# Patient Record
Sex: Female | Born: 1975 | Race: Black or African American | Hispanic: No | Marital: Married | Smoking: Former smoker
Health system: Southern US, Community
[De-identification: ages and names within clinical notes are randomized; demographics above are authoritative.]

## PROBLEM LIST (undated history)

## (undated) DIAGNOSIS — I10 Essential (primary) hypertension: Secondary | ICD-10-CM

## (undated) DIAGNOSIS — T7840XA Allergy, unspecified, initial encounter: Secondary | ICD-10-CM

## (undated) DIAGNOSIS — K219 Gastro-esophageal reflux disease without esophagitis: Secondary | ICD-10-CM

## (undated) DIAGNOSIS — I471 Supraventricular tachycardia, unspecified: Secondary | ICD-10-CM

## (undated) DIAGNOSIS — E119 Type 2 diabetes mellitus without complications: Secondary | ICD-10-CM

## (undated) HISTORY — PX: CHOLECYSTECTOMY: SHX55

## (undated) HISTORY — DX: Allergy, unspecified, initial encounter: T78.40XA

## (undated) HISTORY — DX: Gastro-esophageal reflux disease without esophagitis: K21.9

## (undated) HISTORY — PX: TUBAL LIGATION: SHX77

## (undated) HISTORY — DX: Supraventricular tachycardia, unspecified: I47.10

---

## 1998-03-29 ENCOUNTER — Emergency Department (HOSPITAL_COMMUNITY): Admission: EM | Admit: 1998-03-29 | Discharge: 1998-03-29 | Payer: Self-pay | Admitting: Emergency Medicine

## 2000-06-18 ENCOUNTER — Inpatient Hospital Stay (HOSPITAL_COMMUNITY): Admission: AD | Admit: 2000-06-18 | Discharge: 2000-06-20 | Payer: Self-pay | Admitting: Obstetrics and Gynecology

## 2001-02-12 ENCOUNTER — Emergency Department (HOSPITAL_COMMUNITY): Admission: EM | Admit: 2001-02-12 | Discharge: 2001-02-12 | Payer: Self-pay | Admitting: Emergency Medicine

## 2001-02-12 ENCOUNTER — Encounter: Payer: Self-pay | Admitting: Emergency Medicine

## 2001-03-19 ENCOUNTER — Emergency Department (HOSPITAL_COMMUNITY): Admission: EM | Admit: 2001-03-19 | Discharge: 2001-03-19 | Payer: Self-pay | Admitting: Emergency Medicine

## 2002-10-18 ENCOUNTER — Encounter: Payer: Self-pay | Admitting: Internal Medicine

## 2002-10-18 ENCOUNTER — Encounter: Admission: RE | Admit: 2002-10-18 | Discharge: 2002-10-18 | Payer: Self-pay | Admitting: Internal Medicine

## 2002-11-14 ENCOUNTER — Observation Stay (HOSPITAL_COMMUNITY): Admission: AD | Admit: 2002-11-14 | Discharge: 2002-11-15 | Payer: Self-pay | Admitting: General Surgery

## 2002-11-14 ENCOUNTER — Encounter (INDEPENDENT_AMBULATORY_CARE_PROVIDER_SITE_OTHER): Payer: Self-pay | Admitting: Specialist

## 2002-11-14 ENCOUNTER — Encounter: Payer: Self-pay | Admitting: General Surgery

## 2003-10-19 ENCOUNTER — Emergency Department (HOSPITAL_COMMUNITY): Admission: EM | Admit: 2003-10-19 | Discharge: 2003-10-19 | Payer: Self-pay | Admitting: Emergency Medicine

## 2004-12-17 ENCOUNTER — Inpatient Hospital Stay (HOSPITAL_COMMUNITY): Admission: AD | Admit: 2004-12-17 | Discharge: 2004-12-17 | Payer: Self-pay | Admitting: *Deleted

## 2004-12-17 ENCOUNTER — Encounter (INDEPENDENT_AMBULATORY_CARE_PROVIDER_SITE_OTHER): Payer: Self-pay | Admitting: *Deleted

## 2005-03-26 ENCOUNTER — Inpatient Hospital Stay (HOSPITAL_COMMUNITY): Admission: AD | Admit: 2005-03-26 | Discharge: 2005-03-28 | Payer: Self-pay | Admitting: Obstetrics

## 2005-03-27 ENCOUNTER — Encounter (INDEPENDENT_AMBULATORY_CARE_PROVIDER_SITE_OTHER): Payer: Self-pay | Admitting: *Deleted

## 2006-12-09 ENCOUNTER — Emergency Department (HOSPITAL_COMMUNITY): Admission: EM | Admit: 2006-12-09 | Discharge: 2006-12-09 | Payer: Self-pay | Admitting: Emergency Medicine

## 2006-12-25 ENCOUNTER — Emergency Department (HOSPITAL_COMMUNITY): Admission: EM | Admit: 2006-12-25 | Discharge: 2006-12-26 | Payer: Self-pay | Admitting: Emergency Medicine

## 2010-06-16 ENCOUNTER — Emergency Department (HOSPITAL_COMMUNITY): Admission: EM | Admit: 2010-06-16 | Discharge: 2010-06-16 | Payer: Self-pay | Admitting: Emergency Medicine

## 2010-11-10 DIAGNOSIS — I639 Cerebral infarction, unspecified: Secondary | ICD-10-CM

## 2010-11-10 HISTORY — DX: Cerebral infarction, unspecified: I63.9

## 2011-01-18 ENCOUNTER — Emergency Department (HOSPITAL_COMMUNITY)
Admission: EM | Admit: 2011-01-18 | Discharge: 2011-01-18 | Disposition: A | Discharge status: Home / Self Care | Payer: Self-pay | Attending: Emergency Medicine | Admitting: Emergency Medicine

## 2011-01-18 DIAGNOSIS — R51 Headache: Secondary | ICD-10-CM | POA: Insufficient documentation

## 2011-01-18 DIAGNOSIS — H9319 Tinnitus, unspecified ear: Secondary | ICD-10-CM | POA: Insufficient documentation

## 2011-01-18 DIAGNOSIS — I10 Essential (primary) hypertension: Secondary | ICD-10-CM | POA: Insufficient documentation

## 2011-01-18 LAB — POCT I-STAT, CHEM 8
Calcium, Ion: 1.09 mmol/L — ABNORMAL LOW (ref 1.12–1.32)
Chloride: 105 mEq/L (ref 96–112)
Creatinine, Ser: 0.8 mg/dL (ref 0.4–1.2)
Hemoglobin: 13.3 g/dL (ref 12.0–15.0)
Sodium: 139 mEq/L (ref 135–145)

## 2011-01-18 LAB — URINALYSIS, ROUTINE W REFLEX MICROSCOPIC
Bilirubin Urine: NEGATIVE
Protein, ur: NEGATIVE mg/dL
Specific Gravity, Urine: 1.022 (ref 1.005–1.030)

## 2011-01-18 LAB — URINE MICROSCOPIC-ADD ON

## 2011-01-24 LAB — POCT PREGNANCY, URINE: Preg Test, Ur: NEGATIVE

## 2011-03-28 NOTE — Discharge Summary (Signed)
NAME:  Ashley Weaver, Ashley Weaver NO.:  [id number]   MEDICAL RECORD NO.:  [id number]          PATIENT TYPE:  INP   LOCATION:  9107                          FACILITY:  [facility]   PHYSICIAN:  Charles A. Clearance Coots, M.D.DATE OF BIRTH:  11/10/1976   DATE OF ADMISSION:  03/26/2005  DATE OF DISCHARGE:                                 DISCHARGE SUMMARY   ADMITTING DIAGNOSES:  1.  Multiparity at term.  2.  Early labor.   DISCHARGE DIAGNOSES:  1.  Multiparity at term.  2.  Early labor.  3.  Status post normal spontaneous vaginal delivery of viable female on Mar 26, 2005 at 1326; Apgars of 9 at one minute and 9 at five minutes;      weight of 3510 g, length of 54 cm. Mother and infant discharged home in      good condition.   REASON FOR ADMISSION:  A 35 year old black female para 2 with estimated date  of confinement of Apr 04, 2005 presents with intrauterine pregnancy at [redacted]  weeks gestation with uterine contractions. She denied rupture of membranes.   PAST MEDICAL HISTORY:  Surgery:  Cholecystectomy. Illnesses:  None.   MEDICATIONS:  Prenatal vitamins.   ALLERGIES:  No known drug allergies.   SOCIAL HISTORY:  Negative for tobacco, alcohol, or recreational drug use.   PHYSICAL EXAMINATION:  GENERAL:  Well-nourished, well-developed black female  in no acute distress.  VITAL SIGNS:  Afebrile, vital signs stable.  ABDOMEN:  Gravid and nontender.  PELVIC:  Cervix 3-4 cm dilated, 60% effaced, and the vertex at a -3 station.   ADMITTING LABORATORY VALUES:  Hemoglobin 11.1; hematocrit 34.4; white blood  cell count 5500; platelets 237,000. RPR was nonreactive.   HOSPITAL COURSE:  The patient was admitted and active rupture of membranes  was performed, and she progressed quite rapidly to a normal spontaneous  vaginal delivery without complications. The postpartum course was  uncomplicated. The patient was taken to the operating room on postpartum day  #1 for a tubal ligation  procedure. There were no complications. The  remainder of the postpartum and postoperative course was uncomplicated and  the patient was discharged home on postpartum day #2 in good condition.   DISCHARGE LABORATORY VALUES:  Hemoglobin 9.7; hematocrit 29.7; white blood  cell count 6000; platelets 194,000.   DISCHARGE DISPOSITION:  1.  Medications:  Tylox and ibuprofen was prescribed for pain. Continue      prenatal vitamins.  2.  Routine written obstructions were given for obstetrical discharge.  3.  The patient is to call the office for a follow-up appointment in 2      weeks.      CAH/MEDQ  D:  03/28/2005  T:  03/28/2005  Job:  [id number]
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
## 2011-03-28 NOTE — Op Note (Signed)
NAME:  Ashley Weaver, Ashley Weaver NO.:  [id number]   MEDICAL RECORD NO.:  [id number]          PATIENT TYPE:  INP   LOCATION:  9107                          FACILITY:  [facility]   PHYSICIAN:  Roseanna Rainbow, M.D.DATE OF BIRTH:  Jun 09, 1976   DATE OF PROCEDURE:  03/27/2005  DATE OF DISCHARGE:                                 OPERATIVE REPORT   PREOPERATIVE DIAGNOSES:  Multiparity, desires a sterilization procedure.   POSTOPERATIVE DIAGNOSES:  Multiparity, desires a sterilization procedure.   PROCEDURE:  Modified Pomeroy bilateral tubal ligation.   SURGEON:  Roseanna Rainbow, M.D.   ANESTHESIA:  General endotracheal, local.   ESTIMATED BLOOD LOSS:  Minimal.   IV FLUIDS:  As per anesthesiology.   DESCRIPTION OF PROCEDURE:  This patient was taken to the operating room.  She was prepped and draped in the usual sterile fashion.  An infraumbilical  skin incision was made with a scalpel and carried down to the underlying  fascia.  The fascia was tented up with Kocher clamps and entered with curved  Mayo scissors.  This incision was extended bilaterally.  The parietal  peritoneum was tented up and enter sharply with Metzenbaum scissors.  This  incision was then extended bilaterally.  The peritoneal incision was felt to  be free of adhesions.  Retractors were then placed into the incision.  The  right fallopian tube was then grasped Babcock clamp.  It was then followed  out to the fimbriated end.  The tube was regrasped in the mid isthmic  region.  A 2 to 3 cm segment of tube was then doubly ligated, with three  ties of 0 plain and excised.  Adequate hemostasis was noted, and the tube  was returned to the abdomen.  The left fallopian tube was then manipulated  in a similar fashion.  The parietal peritoneum and fascia were  reapproximated in one layer, using a running suture of 0 Vicryl.  The skin  was reapproximated in a subcuticular fashion using 3-0 Vicryl.  At the close  of the procedure, the instrument and pad counts were said to be correct  times two.  The patient was taken to the PACU awake and in stable condition.      LAJ/MEDQ  D:  03/27/2005  T:  03/27/2005  Job:  [id number]

## 2011-03-28 NOTE — Op Note (Signed)
NAMELehman Prom                          ACCOUNT NO.:  [id number]   MEDICAL RECORD NO.:  [id number]                   PATIENT TYPE:   LOCATION:                                       FACILITY:   PHYSICIAN:  Angelia Mould. Derrell Lolling, M.D.             DATE OF BIRTH:   DATE OF PROCEDURE:  11/14/2002  DATE OF DISCHARGE:                                 OPERATIVE REPORT   PREOPERATIVE DIAGNOSIS:  Chronic cholecystitis with cholelithiasis.   POSTOPERATIVE DIAGNOSIS:  Chronic cholecystitis with cholelithiasis.   OPERATION PERFORMED:  Laparoscopic cholecystectomy with intraoperative  cholangiogram.   SURGEON:  Angelia Mould. Derrell Lolling, M.D.   FIRST ASSISTANT:  Anselm Pancoast. Zachery Dakins, M.D.   OPERATIVE INDICATIONS:  This is a 35 year old black female who has had a few  episodes of severe upper abdominal pain and bloating and reflux.  Over the  past three to four weeks this has been occurring more frequently, almost  daily and more localized to the right upper quadrant with associated nausea  that occurs frequently after meals.  Workup reveals a gallbladder ultrasound  showing multiple gallstones and contracted gallbladder and a normal common  bile duct.  Liver function tests are normal.  She is brought to the  operating room electively.   OPERATIVE FINDINGS:  The gallbladder was chronically inflamed, discolored,  and slightly thick walled.  The liver, stomach, duodenum, small intestine,  large intestine, and peritoneal surfaces otherwise looked normal.  The  cholangiogram was normal, demonstrating normal intrahepatic and extrahepatic  anatomy, no filling defect and prompt flow of contrast into the duodenum.   OPERATIVE TECHNIQUE:  Following the induction of general endotracheal  anesthesia, the patient's abdomen was prepped and draped in a sterile  fashion.  Marcaine 0.5% with epinephrine was used as a local infiltration  anesthetic.  A vertically oriented incision was made inside the lower rim  of  the umbilicus.  The fascia was incised in the midline and the abdominal  cavity entered under direct vision.  A 10-mm Hasson trocar was inserted and  secured with a pursestring of 0 Vicryl.  Pneumoperitoneum was created.  The  video camera was inserted with visualization and findings as described  above.  A 10-mm trocar was placed in the subxiphoid region.  Two 5-mm  trocars were placed in the right mid abdomen.  The gallbladder fundus was  elevated.  Some adhesions were taken down.  The infundibulum of the  gallbladder was then retracted laterally.  We dissected out the cystic duct  and cystic artery.  The cystic artery was isolated as it went onto the  gallbladder wall, secured with metal clips and divided.  A large window was  created behind the cystic duct.  A metal clip was placed on the cystic duct  close to the gallbladder.  The cystic duct was opened and a cholangiogram  catheter inserted.  We did have good bile return through  the cystic duct.  Cholangiogram was obtained using the C-arm.  The cholangiogram was normal  showing normal intrahepatic and extrahepatic bile ducts, no filling defects,  and prompt flow of contrast into the duodenum.  The cholangiogram catheter  was removed.  The cystic duct was secured with multiple metal clips and  divided.  The gallbladder was dissected from its bed with electrocautery and  removed through the umbilical port.  The operative field was irrigated.  Irrigation fluid returned clear.  At the completion of the case there was no  bleeding and no bile leak whatsoever from the gallbladder bed or around the  cystic duct and cystic artery.  The trocars were removed under direct  vision, there was no bleeding from the trocar sites.  The pneumoperitoneum  was released.  The fascia at the umbilicus was closed with 0 Vicryl sutures.  The skin incisions were closed with subcuticular sutures of 4-0 Vicryl and  Steri-Strips.  Clean bandages were placed  and the patient taken to the  recovery room in stable condition.  Estimated blood loss was about 10 cc.   COMPLICATIONS:  None.  Sponge, instrument and needle counts were correct.                                                Angelia Mould. Derrell Lolling, M.D.    HMI/MEDQ  D:  11/14/2002  T:  11/14/2002  Job:  [id number]   cc:   Gaspar Garbe, M.D.  [address]  [place]  [place] [address]  Fax: [phone]

## 2011-08-29 ENCOUNTER — Inpatient Hospital Stay (INDEPENDENT_AMBULATORY_CARE_PROVIDER_SITE_OTHER)
Admission: RE | Admit: 2011-08-29 | Discharge: 2011-08-29 | Disposition: A | Discharge status: Home / Self Care | Payer: Self-pay | Source: Ambulatory Visit | Attending: Family Medicine | Admitting: Family Medicine

## 2011-08-29 ENCOUNTER — Ambulatory Visit (INDEPENDENT_AMBULATORY_CARE_PROVIDER_SITE_OTHER): Payer: Self-pay

## 2011-08-29 DIAGNOSIS — M436 Torticollis: Secondary | ICD-10-CM

## 2011-09-06 ENCOUNTER — Emergency Department (HOSPITAL_COMMUNITY): Payer: Self-pay

## 2011-09-06 ENCOUNTER — Emergency Department (HOSPITAL_COMMUNITY)
Admission: EM | Admit: 2011-09-06 | Discharge: 2011-09-06 | Disposition: A | Discharge status: Home / Self Care | Payer: Self-pay | Attending: Emergency Medicine | Admitting: Emergency Medicine

## 2011-09-06 ENCOUNTER — Encounter (HOSPITAL_COMMUNITY): Payer: Self-pay | Admitting: Radiology

## 2011-09-06 DIAGNOSIS — A599 Trichomoniasis, unspecified: Secondary | ICD-10-CM | POA: Insufficient documentation

## 2011-09-06 DIAGNOSIS — R079 Chest pain, unspecified: Secondary | ICD-10-CM | POA: Insufficient documentation

## 2011-09-06 DIAGNOSIS — M79609 Pain in unspecified limb: Secondary | ICD-10-CM | POA: Insufficient documentation

## 2011-09-06 DIAGNOSIS — A64 Unspecified sexually transmitted disease: Secondary | ICD-10-CM | POA: Insufficient documentation

## 2011-09-06 DIAGNOSIS — R1011 Right upper quadrant pain: Secondary | ICD-10-CM | POA: Insufficient documentation

## 2011-09-06 DIAGNOSIS — R110 Nausea: Secondary | ICD-10-CM | POA: Insufficient documentation

## 2011-09-06 DIAGNOSIS — I10 Essential (primary) hypertension: Secondary | ICD-10-CM | POA: Insufficient documentation

## 2011-09-06 DIAGNOSIS — M25519 Pain in unspecified shoulder: Secondary | ICD-10-CM | POA: Insufficient documentation

## 2011-09-06 HISTORY — DX: Essential (primary) hypertension: I10

## 2011-09-06 LAB — POCT PREGNANCY, URINE: Preg Test, Ur: NEGATIVE

## 2011-09-06 LAB — POCT I-STAT, CHEM 8
BUN: 14 mg/dL (ref 6–23)
Creatinine, Ser: 0.7 mg/dL (ref 0.50–1.10)
Glucose, Bld: 129 mg/dL — ABNORMAL HIGH (ref 70–99)
Hemoglobin: 14.3 g/dL (ref 12.0–15.0)
Potassium: 3.7 mEq/L (ref 3.5–5.1)
Sodium: 139 mEq/L (ref 135–145)
TCO2: 24 mmol/L (ref 0–100)

## 2011-09-06 LAB — HEPATIC FUNCTION PANEL
ALT: 10 U/L (ref 0–35)
AST: 13 U/L (ref 0–37)
Albumin: 3.5 g/dL (ref 3.5–5.2)
Alkaline Phosphatase: 67 U/L (ref 39–117)
Bilirubin, Direct: 0.1 mg/dL (ref 0.0–0.3)
Total Bilirubin: 0.3 mg/dL (ref 0.3–1.2)
Total Protein: 7.2 g/dL (ref 6.0–8.3)

## 2011-09-06 LAB — URINE MICROSCOPIC-ADD ON

## 2011-09-06 LAB — URINALYSIS, ROUTINE W REFLEX MICROSCOPIC
Glucose, UA: NEGATIVE mg/dL
Nitrite: NEGATIVE
Urobilinogen, UA: 1 mg/dL (ref 0.0–1.0)
pH: 6.5 (ref 5.0–8.0)

## 2011-09-06 LAB — CBC
HCT: 38.5 % (ref 36.0–46.0)
MCH: 29.2 pg (ref 26.0–34.0)
MCHC: 34 g/dL (ref 30.0–36.0)
MCV: 85.9 fL (ref 78.0–100.0)
RBC: 4.48 MIL/uL (ref 3.87–5.11)
RDW: 12.5 % (ref 11.5–15.5)
WBC: 6.9 10*3/uL (ref 4.0–10.5)

## 2011-09-06 LAB — POCT I-STAT TROPONIN I: Troponin i, poc: 0 ng/mL (ref 0.00–0.08)

## 2011-09-06 LAB — WET PREP, GENITAL: Clue Cells Wet Prep HPF POC: NONE SEEN

## 2011-09-06 LAB — DIFFERENTIAL
Basophils Relative: 0 % (ref 0–1)
Eosinophils Absolute: 0.1 10*3/uL (ref 0.0–0.7)
Monocytes Relative: 7 % (ref 3–12)

## 2011-09-06 MED ORDER — IOHEXOL 300 MG/ML  SOLN
80.0000 mL | Freq: Once | INTRAMUSCULAR | Status: AC | PRN
  Administered 2011-09-06: 80 mL via INTRAVENOUS

## 2011-09-08 LAB — GC/CHLAMYDIA PROBE AMP, GENITAL: GC Probe Amp, Genital: NEGATIVE

## 2012-01-09 ENCOUNTER — Emergency Department (HOSPITAL_COMMUNITY)
Admission: EM | Admit: 2012-01-09 | Discharge: 2012-01-09 | Disposition: A | Discharge status: Home / Self Care | Payer: Self-pay | Source: Home / Self Care | Attending: Emergency Medicine | Admitting: Emergency Medicine

## 2012-01-09 ENCOUNTER — Other Ambulatory Visit: Payer: Self-pay

## 2012-01-09 ENCOUNTER — Emergency Department (INDEPENDENT_AMBULATORY_CARE_PROVIDER_SITE_OTHER): Payer: Self-pay

## 2012-01-09 ENCOUNTER — Encounter (HOSPITAL_COMMUNITY): Payer: Self-pay

## 2012-01-09 DIAGNOSIS — R05 Cough: Secondary | ICD-10-CM

## 2012-01-09 DIAGNOSIS — R059 Cough, unspecified: Secondary | ICD-10-CM

## 2012-01-09 DIAGNOSIS — R000 Tachycardia, unspecified: Secondary | ICD-10-CM

## 2012-01-09 MED ORDER — GUAIFENESIN-CODEINE 100-10 MG/5ML PO SYRP: 5.0000 mL | ORAL_SOLUTION | Freq: Three times a day (TID) | ORAL | Status: AC | PRN

## 2012-01-09 NOTE — ED Provider Notes (Signed)
Medical screening examination/treatment/procedure(s) were performed by resident physician and as supervising physician I was immediately available for consultation/collaboration.  Richardo Priest, MD   Richardo Priest, MD 01/09/12 2111

## 2012-01-09 NOTE — ED Provider Notes (Signed)
Ashley Weaver is a 36 y.o. female who presents to Urgent Care today for 4 days of cough. Patient has a nonproductive cough. It is associated with a mild subjective fever mild subjective difficulty breathing sore throat and nonexertional nonradiating chest pain.  She describes the pain as sharp and with deep breaths or coughs.  She denies any difficulty climbing stairs or pain worsening with exertion such as climbing stairs or walking.  She has tried Tylenol and cough/cold medication over-the-counter which has not helped much. She is eating and drinking and feels well otherwise.     PMH reviewed. No personal history of coronary artery disease DVT or PE.  Positive medical history for hypertension ROS as above otherwise neg Medications reviewed. She takes a blood pressure medicine but cannot remember the name   Exam:  BP 144/96  Pulse 118  Temp(Src) 99.3 F (37.4 C) (Oral)  Resp 16  SpO2 100% Gen: Well NAD HEENT: EOMI,  MMM, posterior pharynx mildly erythematous Lungs: CTABL Nl WOB Heart: Tachycardia but regular no MRG Abd: NABS, NT, ND Exts: Non edematous BL  LE, warm and well perfused. Equal calf size bilaterally  EKG: Sinus rhythm at 118 beats per minute. Otherwise not significantly changed from prior studies. No ST segment abnormalities. Chest x-ray: No acute disease  Assessment and Plan: 36 year old woman with cough and sinus tachycardia.  Additionally does have some nonexertional atypical sounding chest pain and mild shortness of breath occasionally. Chanes of MI, pneumonia, PE unlikely. Well's score is low risk category.  Most likely diagnosis is viral URI with cough possibly influenza. Tachycardia is likely a side effect of over-the-counter cough and cold medication or mild dehydration.  Plan to discharge patient home with detailed precautions for worsening breathing or chest pain. Will also treat cough with guaifenesin/codeine cough syrup.  Patient will return to clinic on Monday if  no improvement or the emergency room if worsening.  Patient expresses understanding.     [name], MD 01/09/12 1910

## 2012-01-09 NOTE — Discharge Instructions (Signed)
Thank you for coming in today. Use the cough medicine as needed.  If you have worsening trouble breathing, or chest pain go to the ER.  If you are getting worse go to the ER.  Come back to urgent care if you still are feeling bad on Monday.  Take care and drink lots of fluids.

## 2012-03-13 IMAGING — CR DG CHEST 2V
2 series · 2 of 2 positions shown · non-contrast
Comparison: Radiographs 06/16/2010.  CT 09/06/2011.

CLINICAL DATA: Cough with fever for 6 days.

CHEST - 2 VIEW

[view not recorded (1 of 2)]
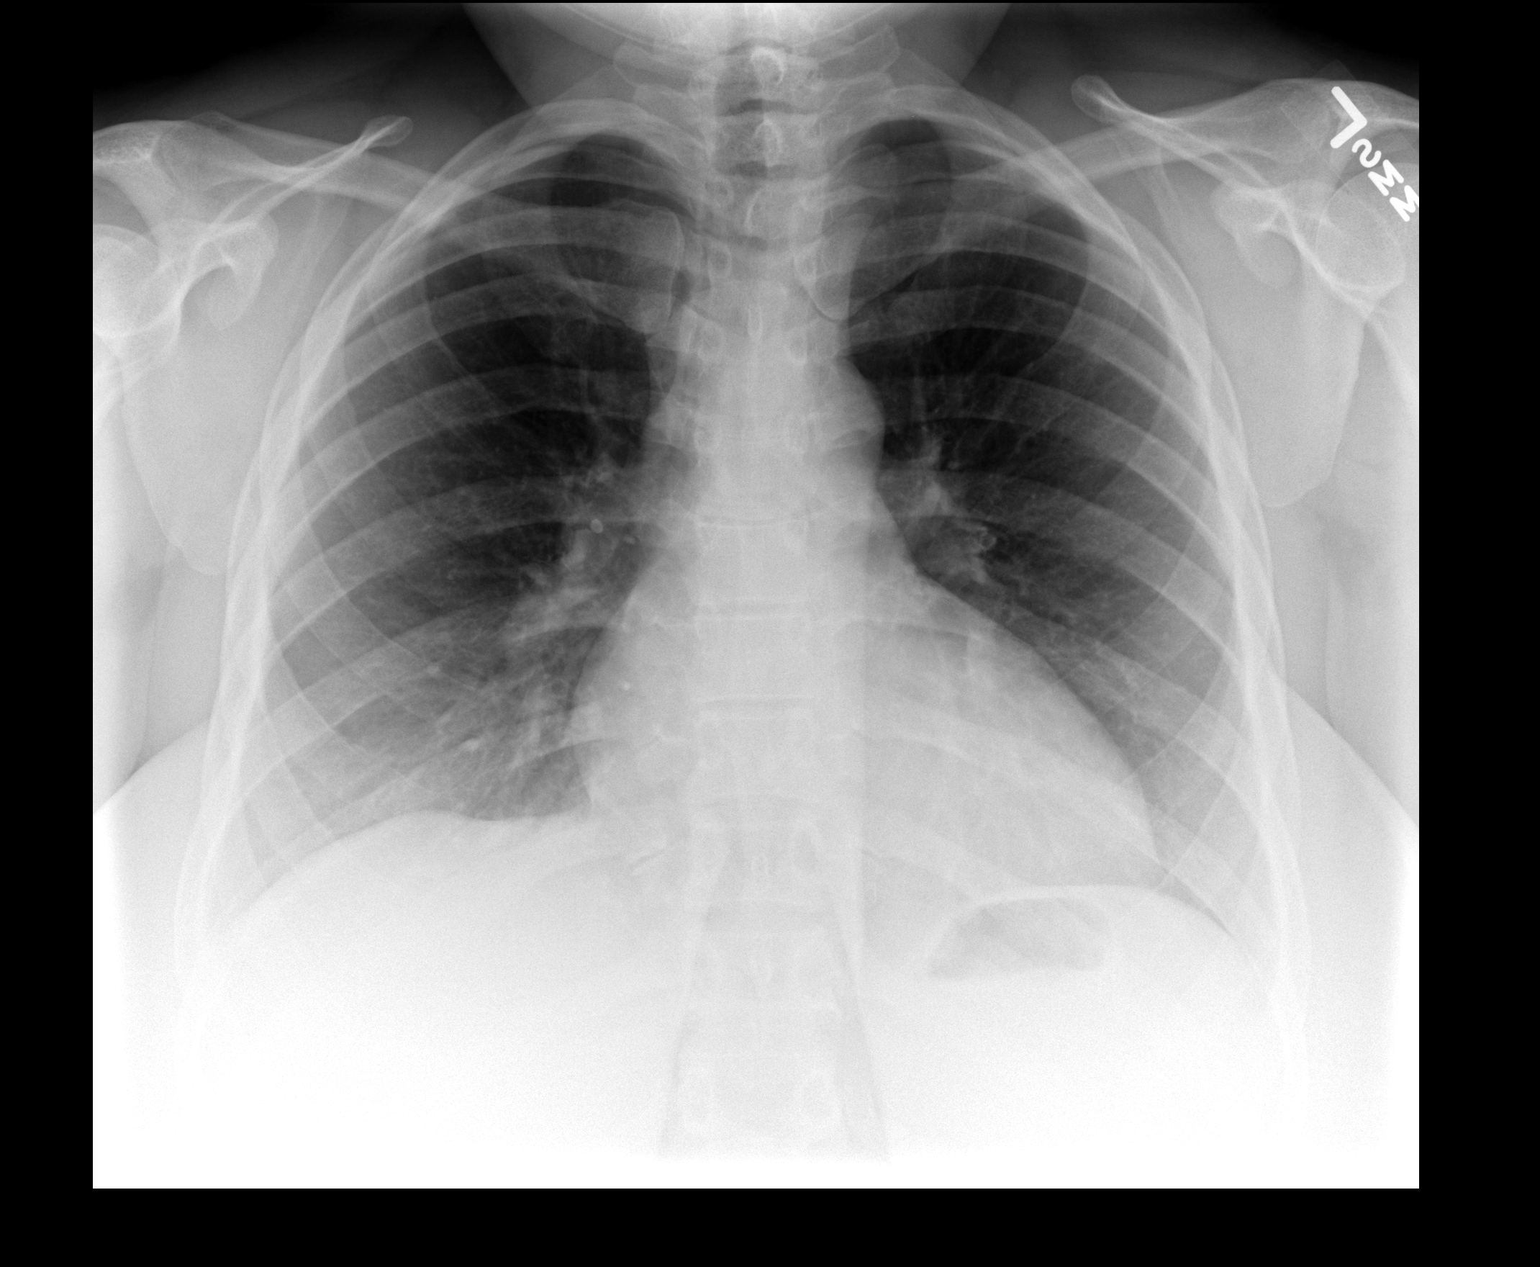

[view not recorded (2 of 2)]
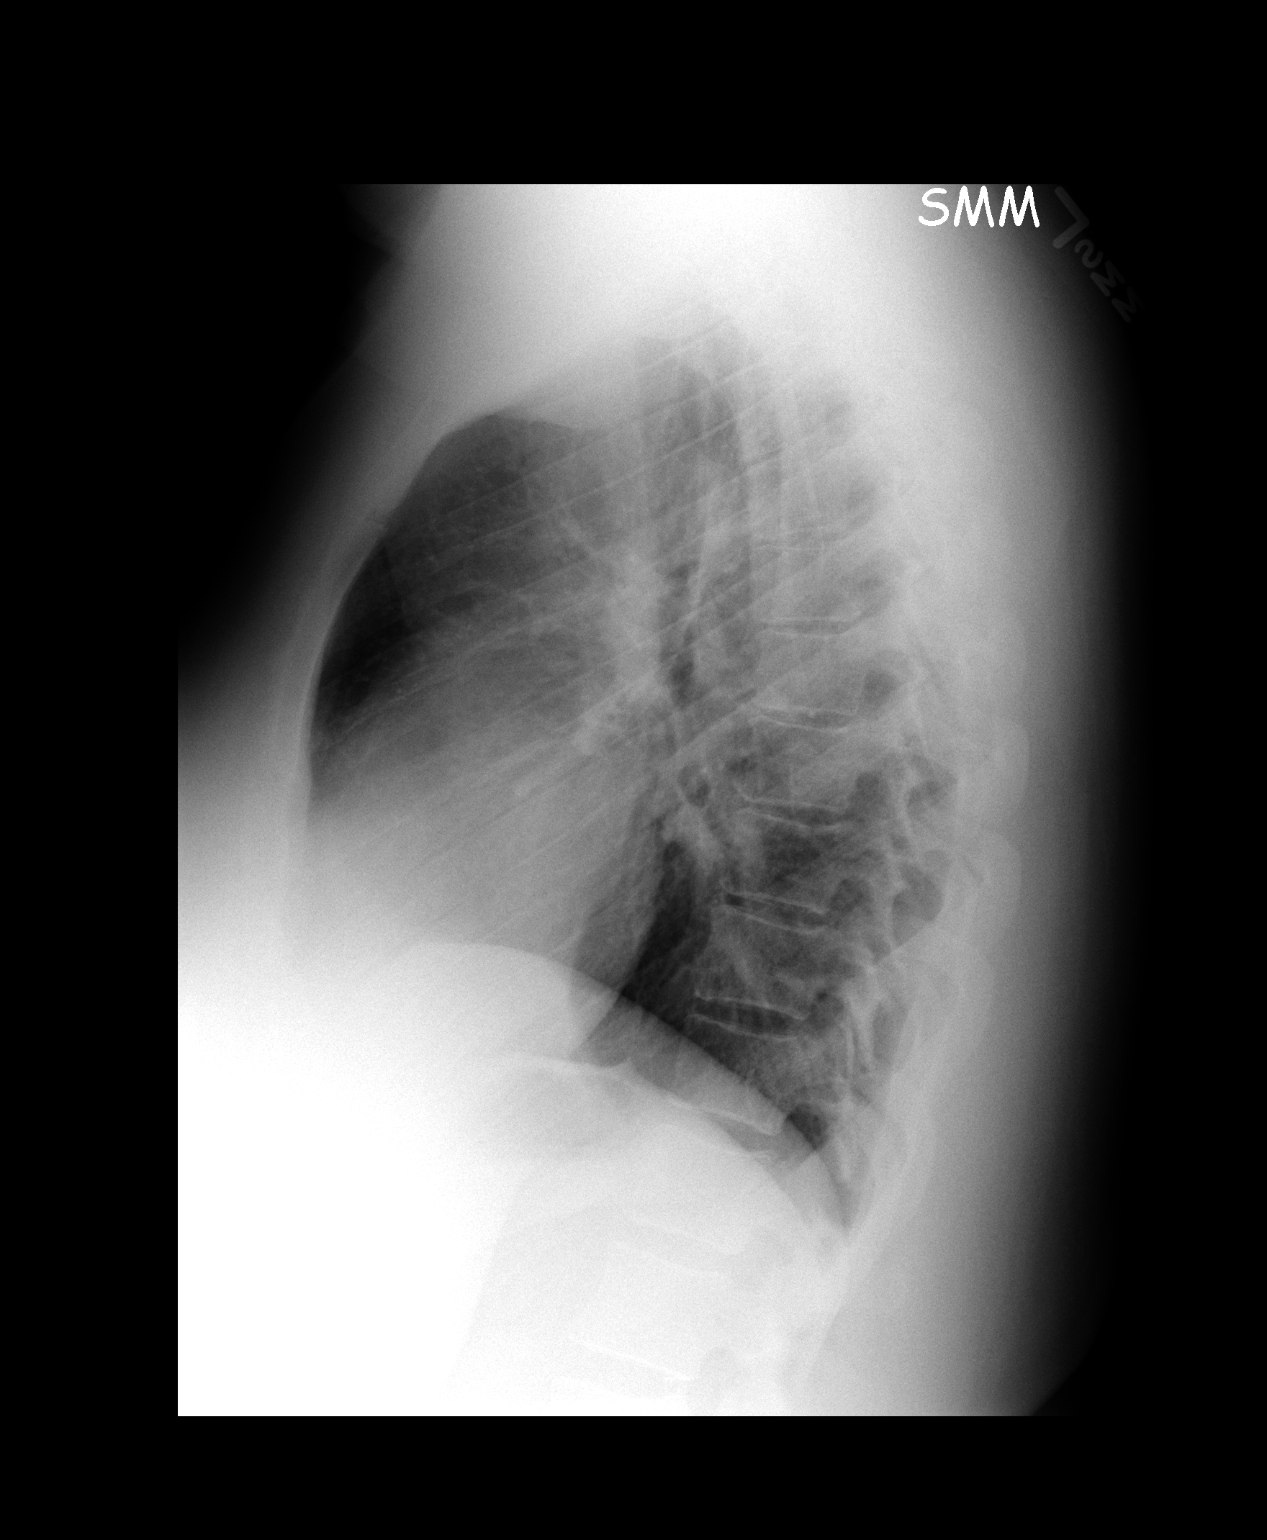

[2 of 2 positions shown; findings below may reference images not displayed]

FINDINGS: There is suboptimal inspiration on the frontal
examination. The heart size and mediastinal contours are normal.
The lungs are clear. There is no pleural effusion or pneumothorax.
No acute osseous findings are identified.
IMPRESSION: No active cardiopulmonary process.

## 2012-06-25 ENCOUNTER — Encounter (HOSPITAL_COMMUNITY): Payer: Self-pay | Admitting: Emergency Medicine

## 2012-06-25 ENCOUNTER — Emergency Department (HOSPITAL_COMMUNITY): Payer: Self-pay

## 2012-06-25 ENCOUNTER — Emergency Department (HOSPITAL_COMMUNITY)
Admission: EM | Admit: 2012-06-25 | Discharge: 2012-06-25 | Disposition: A | Discharge status: Home / Self Care | Payer: Self-pay | Attending: Emergency Medicine | Admitting: Emergency Medicine

## 2012-06-25 DIAGNOSIS — Z79899 Other long term (current) drug therapy: Secondary | ICD-10-CM | POA: Insufficient documentation

## 2012-06-25 DIAGNOSIS — I10 Essential (primary) hypertension: Secondary | ICD-10-CM | POA: Insufficient documentation

## 2012-06-25 DIAGNOSIS — R51 Headache: Secondary | ICD-10-CM | POA: Insufficient documentation

## 2012-06-25 MED ORDER — SODIUM CHLORIDE 0.9 % IV BOLUS (SEPSIS)
1000.0000 mL | Freq: Once | INTRAVENOUS | Status: AC
  Administered 2012-06-25: 1000 mL via INTRAVENOUS

## 2012-06-25 MED ORDER — HYDROCHLOROTHIAZIDE 25 MG PO TABS: 25.0000 mg | ORAL_TABLET | Freq: Every day | ORAL | Status: DC

## 2012-06-25 MED ORDER — METOCLOPRAMIDE HCL 5 MG/ML IJ SOLN
10.0000 mg | Freq: Once | INTRAMUSCULAR | Status: AC
  Administered 2012-06-25: 10 mg via INTRAVENOUS
  Filled 2012-06-25: qty 2

## 2012-06-25 MED ORDER — AMLODIPINE BESYLATE 5 MG PO TABS
5.0000 mg | ORAL_TABLET | ORAL | Status: AC
  Administered 2012-06-25: 5 mg via ORAL
  Filled 2012-06-25: qty 1

## 2012-06-25 MED ORDER — DEXAMETHASONE SODIUM PHOSPHATE 10 MG/ML IJ SOLN
10.0000 mg | Freq: Once | INTRAMUSCULAR | Status: AC
  Administered 2012-06-25: 10 mg via INTRAVENOUS
  Filled 2012-06-25: qty 1

## 2012-06-25 MED ORDER — DIPHENHYDRAMINE HCL 50 MG/ML IJ SOLN
25.0000 mg | Freq: Once | INTRAMUSCULAR | Status: AC
  Administered 2012-06-25: 25 mg via INTRAVENOUS
  Filled 2012-06-25: qty 1

## 2012-06-25 NOTE — ED Notes (Signed)
Pt returned from CT °

## 2012-06-25 NOTE — ED Notes (Signed)
PT. REPORTS HEADACHE , DENIES INJURY , NO NAUSEA OR VOMITTING , UNRELIEVED BY OTC PAIN MEDICATION .

## 2012-06-25 NOTE — ED Notes (Signed)
Pt to CT

## 2012-06-28 NOTE — ED Provider Notes (Signed)
History     CSN: [id number]  Arrival date & time 06/25/12  0014   First MD Initiated Contact with Patient 06/25/12 0022      Chief Complaint  Patient presents with  . Headache    (Consider location/radiation/quality/duration/timing/severity/associated sxs/prior treatment) HPI Hx from pt. Ashley Weaver is a 36 y.o. female presenting with headache which started today when she woke up. HA is described as aching and is located to the generalized head. Does not radiate. pain is constant, unrelieved with ibuprofen. This is not "the worst headache of her life." She has had associated photophobia and slight lightheadedness with this. Pt also reports that she has had a tingling sensation to the R side of her body. She does have a hx of hypertension and reports that she has had tingling like this before to her R arm when her BP is elevated. She reports that she's been checking her BPs over the past several days at home and it has been elevated with systolics in the 150s. Currently only taking amlodipine for BP, not followed by a PCP. She denies any associated weakness, difficulty walking, changes in vision, dizziness, nausea, vomiting, difficulty speaking, chest pain, shortness of breath, abd pain.  Past Medical History  Diagnosis Date  . Hypertension     History reviewed. No pertinent past surgical history.  No family history on file.  History  Substance Use Topics  . Smoking status: Former Games developer  . Smokeless tobacco: Not on file  . Alcohol Use: Yes    OB History    Grav Para Term Preterm Abortions TAB SAB Ect Mult Living                  Review of Systems  All other systems reviewed and are negative.    Allergies  Review of patient's allergies indicates no known allergies.  Home Medications   Current Outpatient Rx  Name Route Sig Dispense Refill  . AMLODIPINE BESYLATE 5 MG PO TABS Oral Take 5 mg by mouth daily.    Marland Kitchen HYDROCHLOROTHIAZIDE 25 MG PO TABS Oral Take 1 tablet  (25 mg total) by mouth daily. 30 tablet 3    BP 152/104  Pulse 77  Temp 97.8 F (36.6 C) (Oral)  Resp 16  SpO2 98%  LMP 06/16/2012  Physical Exam  Nursing note and vitals reviewed. Constitutional: She appears well-developed and well-nourished. No distress.  HENT:  Head: Normocephalic and atraumatic.  Mouth/Throat: Oropharynx is clear and moist. No oropharyngeal exudate.  Eyes: Conjunctivae and EOM are normal. Pupils are equal, round, and reactive to light.  Neck: Normal range of motion. Neck supple.       Negative meningeal signs  Cardiovascular: Normal rate, regular rhythm and normal heart sounds.   Pulmonary/Chest: Effort normal and breath sounds normal. She exhibits no tenderness.  Abdominal: Soft. Bowel sounds are normal. There is no tenderness. There is no rebound and no guarding.  Musculoskeletal: Normal range of motion.  Neurological: She is alert.       Some subjective decreased sensation to R arm and leg to lt touch, strength equal as compared with L Speech clear, pupils equal round reactive to light, extraocular movements intact   Normal peripheral visual fields Cranial nerves III through XII normal including no facial droop Follows commands, moves all extremities x4, normal strength to bilateral upper and lower extremities at all major muscle groups including grip Coordination intact, no limb ataxia, finger-nose-finger normal Rapid alternating movements normal No pronator drift Gait normal  Skin: Skin is warm and dry. She is not diaphoretic.  Psychiatric: She has a normal mood and affect.    ED Course  Procedures (including critical care time)  Labs Reviewed - No data to display No results found.   1. Headache   2. Elevated blood pressure       MDM  Pt with headache and tingling to R side. BP is elevated here, 178/111. Pt with some subjective decreased sensation to R arm and leg on exam but no other neuro deficits seen. CT head negative. Pt given  migraine cocktail and had relief of sx with this; sensation improved on repeat neuro exam. Question atypical migraine vs elevated blood pressure effect. Pt instructed that she will need to f/u with pcp regarding elevated BP; additional rx for hctz given. Reasons to return to the ED discussed.       Grant Fontana, PA-C 06/28/12 2310

## 2012-08-03 NOTE — ED Provider Notes (Signed)
Medical screening examination/treatment/procedure(s) were performed by non-physician practitioner and as supervising physician I was immediately available for consultation/collaboration.  Lenix Kidd Lytle Michaels, MD 08/03/12 [phone]

## 2012-12-04 ENCOUNTER — Emergency Department (HOSPITAL_COMMUNITY)
Admission: EM | Admit: 2012-12-04 | Discharge: 2012-12-04 | Disposition: A | Discharge status: Home / Self Care | Payer: Self-pay | Source: Home / Self Care | Attending: Family Medicine | Admitting: Family Medicine

## 2012-12-04 ENCOUNTER — Encounter (HOSPITAL_COMMUNITY): Payer: Self-pay | Admitting: Emergency Medicine

## 2012-12-04 DIAGNOSIS — K529 Noninfective gastroenteritis and colitis, unspecified: Secondary | ICD-10-CM

## 2012-12-04 DIAGNOSIS — K5289 Other specified noninfective gastroenteritis and colitis: Secondary | ICD-10-CM

## 2012-12-04 NOTE — ED Notes (Signed)
Pt is here needing a note for work stating clearing her to go back to work States that she had the stomach virus on Thursday that included vomiting and diarrhea that has resolved Today she denies any medical problems; f/v/n/d, abd pain   She is alert w/no signs of acute distress.

## 2012-12-04 NOTE — ED Provider Notes (Signed)
History     CSN: [id number]  Arrival date & time 12/04/12  1625   First MD Initiated Contact with Patient 12/04/12 1650      Chief Complaint  Patient presents with  . Emesis    (Consider location/radiation/quality/duration/timing/severity/associated sxs/prior treatment) Patient is a 37 y.o. female presenting with vomiting. The history is provided by the patient and the spouse.  Emesis  This is a new problem. The current episode started 2 days ago (24 hr n/v/d, now resolved on fri, needs note for work.). The problem has been resolved. The emesis has an appearance of stomach contents. There has been no fever. Associated symptoms include diarrhea. Pertinent negatives include no abdominal pain. Risk factors include ill contacts.    Past Medical History  Diagnosis Date  . Hypertension     History reviewed. No pertinent past surgical history.  No family history on file.  History  Substance Use Topics  . Smoking status: Former Games developer  . Smokeless tobacco: Not on file  . Alcohol Use: Yes    OB History    Grav Para Term Preterm Abortions TAB SAB Ect Mult Living                  Review of Systems  Constitutional: Negative.   HENT: Negative.   Gastrointestinal: Positive for nausea, vomiting and diarrhea. Negative for abdominal pain.    Allergies  Review of patient's allergies indicates no known allergies.  Home Medications   Current Outpatient Rx  Name  Route  Sig  Dispense  Refill  . AMLODIPINE BESYLATE 5 MG PO TABS   Oral   Take 5 mg by mouth daily.         Marland Kitchen HYDROCHLOROTHIAZIDE 25 MG PO TABS   Oral   Take 1 tablet (25 mg total) by mouth daily.   30 tablet   3     BP 139/88  Pulse 94  Temp 98.6 F (37 C) (Oral)  Resp 18  SpO2 99%  LMP 11/02/2012  Physical Exam  Nursing note and vitals reviewed. Constitutional: She is oriented to person, place, and time. She appears well-developed and well-nourished.  HENT:  Head: Normocephalic.  Mouth/Throat:  Oropharynx is clear and moist.  Neck: Normal range of motion. Neck supple.  Pulmonary/Chest: Breath sounds normal.  Abdominal: Soft. Bowel sounds are normal. There is no tenderness.  Lymphadenopathy:    She has no cervical adenopathy.  Neurological: She is alert and oriented to person, place, and time.  Skin: Skin is warm and dry.    ED Course  Procedures (including critical care time)  Labs Reviewed - No data to display No results found.   1. Gastroenteritis       MDM          Linna Hoff, MD 12/04/12 [phone]

## 2013-06-28 ENCOUNTER — Encounter (HOSPITAL_COMMUNITY): Payer: Self-pay | Admitting: *Deleted

## 2013-06-28 ENCOUNTER — Emergency Department (HOSPITAL_COMMUNITY): Payer: Self-pay

## 2013-06-28 ENCOUNTER — Emergency Department (HOSPITAL_COMMUNITY)
Admission: EM | Admit: 2013-06-28 | Discharge: 2013-06-28 | Disposition: A | Discharge status: Home / Self Care | Payer: Self-pay | Attending: Emergency Medicine | Admitting: Emergency Medicine

## 2013-06-28 DIAGNOSIS — Z79899 Other long term (current) drug therapy: Secondary | ICD-10-CM | POA: Insufficient documentation

## 2013-06-28 DIAGNOSIS — R609 Edema, unspecified: Secondary | ICD-10-CM | POA: Insufficient documentation

## 2013-06-28 DIAGNOSIS — R51 Headache: Secondary | ICD-10-CM | POA: Insufficient documentation

## 2013-06-28 DIAGNOSIS — I10 Essential (primary) hypertension: Secondary | ICD-10-CM | POA: Insufficient documentation

## 2013-06-28 DIAGNOSIS — Z87891 Personal history of nicotine dependence: Secondary | ICD-10-CM | POA: Insufficient documentation

## 2013-06-28 LAB — CBC WITH DIFFERENTIAL/PLATELET
Basophils Relative: 0 % (ref 0–1)
Eosinophils Absolute: 0.1 10*3/uL (ref 0.0–0.7)
Lymphs Abs: 1.6 10*3/uL (ref 0.7–4.0)
MCH: 29.5 pg (ref 26.0–34.0)
Neutro Abs: 2.8 10*3/uL (ref 1.7–7.7)
Neutrophils Relative %: 58 % (ref 43–77)
Platelets: 246 10*3/uL (ref 150–400)
RBC: 4.21 MIL/uL (ref 3.87–5.11)

## 2013-06-28 LAB — POCT I-STAT, CHEM 8
Calcium, Ion: 1.13 mmol/L (ref 1.12–1.23)
Chloride: 103 mEq/L (ref 96–112)
HCT: 38 % (ref 36.0–46.0)
Sodium: 139 mEq/L (ref 135–145)
TCO2: 25 mmol/L (ref 0–100)

## 2013-06-28 LAB — SAMPLE TO BLOOD BANK

## 2013-06-28 MED ORDER — METOCLOPRAMIDE HCL 5 MG/ML IJ SOLN
5.0000 mg | Freq: Once | INTRAMUSCULAR | Status: AC
  Administered 2013-06-28: 5 mg via INTRAVENOUS
  Filled 2013-06-28: qty 2

## 2013-06-28 MED ORDER — HYDROCHLOROTHIAZIDE 25 MG PO TABS: 25.0000 mg | ORAL_TABLET | Freq: Every day | ORAL | Status: DC

## 2013-06-28 MED ORDER — SODIUM CHLORIDE 0.9 % IV SOLN
Freq: Once | INTRAVENOUS | Status: AC
  Administered 2013-06-28: 03:00:00 via INTRAVENOUS

## 2013-06-28 MED ORDER — DIPHENHYDRAMINE HCL 50 MG/ML IJ SOLN
12.5000 mg | Freq: Once | INTRAMUSCULAR | Status: AC
  Administered 2013-06-28: 12.5 mg via INTRAVENOUS
  Filled 2013-06-28: qty 1

## 2013-06-28 MED ORDER — KETOROLAC TROMETHAMINE 30 MG/ML IJ SOLN
30.0000 mg | Freq: Once | INTRAMUSCULAR | Status: AC
  Administered 2013-06-28: 30 mg via INTRAVENOUS
  Filled 2013-06-28: qty 1

## 2013-06-28 NOTE — ED Notes (Signed)
Pt states that at midnight she woke up with a severe HA. Pt denies N/V or light sensitivity. Pt states that the HA is left sided travels from her temple down to the left side of her neck. Pt alert and oriented, no neuro deficits, ambulatory.

## 2013-06-28 NOTE — ED Notes (Signed)
ED NP in to see pt.

## 2013-06-28 NOTE — ED Notes (Signed)
Pt denies n/v. Denies photophobia. Denies blurred vision/double vision. Denies lightheadedness/dizziness. Pt states she took Aleve 400mg  for pain at approx 0045 tonight, but has had no relief. Pt does have hx of HTN and has not taken her usual HTN meds d/t not having PCP. Pt states she does not have hx of migraines, but has had headache r/t her HTN before, but states this time it "feels different."

## 2013-06-28 NOTE — ED Notes (Signed)
Pt alert, NAD, calm, interactive, resps e/u, speaking in clear complete sentences, c/o "only HA", 7/10, (denies: dizziness, sob or nausea), VSS, pt to CT via w/c.

## 2013-06-28 NOTE — ED Provider Notes (Signed)
Medical screening examination/treatment/procedure(s) were performed by non-physician practitioner and as supervising physician I was immediately available for consultation/collaboration.   Candyce Churn, MD 06/28/13 [phone]

## 2013-06-28 NOTE — ED Provider Notes (Signed)
CSN: [id number]     Arrival date & time 06/28/13  4098 History     First MD Initiated Contact with Patient 06/28/13 0208     Chief Complaint  Patient presents with  . Headache   (Consider location/radiation/quality/duration/timing/severity/associated sxs/prior Treatment) HPI Comments: Patient states she woke from sleep with a left-sided frontal headache that is slightly different from her normal headaches.  She took 2 Aleve without relief.  She states, that she has not taken her antihypertensive medication, and a while due to lack of insurance.  Denies any recent illness, sinus infections, rhinitis, urinary tract symptoms, pain with movement of her neck, trauma  Patient is a 37 y.o. female presenting with headaches.  Headache Pain location:  Frontal Quality:  Dull Radiates to:  L neck Severity currently:  5/10 Severity at highest:  6/10 Onset quality:  Unable to specify Duration:  2 hours Timing:  Constant Progression:  Unchanged Chronicity:  New Similar to prior headaches: no   Context: not activity, not exposure to bright light, not caffeine, not coughing, not eating, not intercourse and not loud noise   Relieved by:  Nothing Ineffective treatments:  NSAIDs Associated symptoms: no congestion, no dizziness, no ear pain, no fever, no neck pain and no neck stiffness     Past Medical History  Diagnosis Date  . Hypertension    Past Surgical History  Procedure Laterality Date  . Cholecystectomy     History reviewed. No pertinent family history. History  Substance Use Topics  . Smoking status: Former Games developer  . Smokeless tobacco: Not on file  . Alcohol Use: Yes   OB History   Grav Para Term Preterm Abortions TAB SAB Ect Mult Living                 Review of Systems  Constitutional: Negative for fever and chills.  HENT: Negative for ear pain, congestion, rhinorrhea, neck pain and neck stiffness.   Neurological: Positive for headaches. Negative for dizziness.  All  other systems reviewed and are negative.    Allergies  Review of patient's allergies indicates no known allergies.  Home Medications   Current Outpatient Rx  Name  Route  Sig  Dispense  Refill  . naproxen sodium (ANAPROX) 220 MG tablet   Oral   Take 440 mg by mouth 2 (two) times daily as needed (for pain).         . hydrochlorothiazide (HYDRODIURIL) 25 MG tablet   Oral   Take 1 tablet (25 mg total) by mouth daily.   30 tablet   2    BP 148/89  Pulse 79  Temp(Src) 98.4 F (36.9 C) (Oral)  Resp 15  SpO2 97% Physical Exam  Vitals reviewed. Constitutional: She is oriented to person, place, and time. She appears well-developed and well-nourished.  HENT:  Head: Normocephalic.  Right Ear: External ear normal.  Left Ear: External ear normal.  Nose: Left sinus exhibits frontal sinus tenderness.  Mouth/Throat: Oropharynx is clear and moist.  Eyes: Pupils are equal, round, and reactive to light.  Neck: Normal range of motion.  Cardiovascular: Normal rate and regular rhythm.   Pulmonary/Chest: Effort normal and breath sounds normal.  Musculoskeletal: Normal range of motion. She exhibits edema.  Bilateral ankle, and foot edema  Lymphadenopathy:    She has no cervical adenopathy.  Neurological: She is alert and oriented to person, place, and time.  Skin: Skin is warm and dry. No rash noted. No erythema.    ED Course  Procedures (including critical care time)  Labs Reviewed  POCT I-STAT, CHEM 8 - Abnormal; Notable for the following:    Glucose, Bld 147 (*)    All other components within normal limits  CBC WITH DIFFERENTIAL  SAMPLE TO BLOOD BANK   Ct Head Wo Contrast  06/28/2013   *RADIOLOGY REPORT*  Clinical Data: Left-sided headache  CT HEAD WITHOUT CONTRAST  Technique:  Contiguous axial images were obtained from the base of the skull through the vertex without contrast.  Comparison: Prior study from 06/25/2012.  Findings: Acute intracranial hemorrhage or infarct.   No evidence of midline shift.  CSF containing spaces are normal.  No mass lesion. No extra-axial fluid collection.  Calvarium is intact.  Orbital soft tissues are normal.  Paranasal sinuses and mastoid air cells are clear.  IMPRESSION: Normal head CT with no acute intracranial process.   Original Report Authenticated By: Rise Mu, M.D.   1. Headache   2. Hypertension     MDM  Patient's headache is much improved was discharged home with prescription for hydrochlorothiazide 25 mg daily with 2 refills.  I have also encouraged the patient to establish with a primary care, physician.  She's been given a resource list  Arman Filter, NP 06/28/13 [phone]

## 2013-06-28 NOTE — ED Notes (Signed)
Pt up to b/r, steady gait.  ?

## 2013-06-28 NOTE — ED Notes (Signed)
Back from CT, no changes.  

## 2017-11-22 DIAGNOSIS — I10 Essential (primary) hypertension: Secondary | ICD-10-CM | POA: Insufficient documentation

## 2017-11-22 DIAGNOSIS — I152 Hypertension secondary to endocrine disorders: Secondary | ICD-10-CM | POA: Insufficient documentation

## 2019-12-06 ENCOUNTER — Other Ambulatory Visit: Payer: Self-pay

## 2019-12-06 ENCOUNTER — Ambulatory Visit
Admission: EM | Admit: 2019-12-06 | Discharge: 2019-12-06 | Disposition: A | Discharge status: Home / Self Care | Payer: 59

## 2019-12-06 ENCOUNTER — Encounter: Payer: Self-pay | Admitting: Emergency Medicine

## 2019-12-06 ENCOUNTER — Telehealth: Payer: Self-pay | Admitting: Emergency Medicine

## 2019-12-06 DIAGNOSIS — H60501 Unspecified acute noninfective otitis externa, right ear: Secondary | ICD-10-CM | POA: Diagnosis not present

## 2019-12-06 HISTORY — DX: Type 2 diabetes mellitus without complications: E11.9

## 2019-12-06 MED ORDER — NEOMYCIN-POLYMYXIN-HC 3.5-10000-1 OT SOLN: 3.0000 [drp] | Freq: Four times a day (QID) | OTIC | 0 refills | Status: DC

## 2019-12-06 NOTE — Discharge Instructions (Addendum)
Use eardrops as prescribed for the next week. Return for worsening ear pain, swelling, discharge, bleeding, decreased hearing, development of jaw pain/swelling, fever.  Do NOT use Q-tips as these can cause your ear wax to get stuck, the tips may break off and become a foreign body requiring additional medical care, or puncture your eardrum.  Helpful prevention tip: Use a solution of equal parts isopropyl (rubbing) alcohol and white vinegar (acetic acid) in both ears after swimming. 

## 2019-12-06 NOTE — ED Notes (Signed)
Patient able to ambulate independently  

## 2019-12-06 NOTE — ED Provider Notes (Signed)
Ashley Weaver    CSN: [id number] Arrival date & time: 12/06/19  9381      History   Chief Complaint Chief Complaint  Patient presents with  . Sore Throat    HPI Ashley Weaver is a 44 y.o. female with history of diabetes, hypertension presenting for right-sided throat and ear pain that began yesterday.  Patient states it was worse this morning.  Patient denies fever, difficulty chewing, swallowing, breathing, chest pain, arthralgias, myalgias.  Has not take anything for symptoms.   Past Medical History:  Diagnosis Date  . Diabetes mellitus without complication (HCC)   . Hypertension     There are no problems to display for this patient.   Past Surgical History:  Procedure Laterality Date  . CHOLECYSTECTOMY      OB History   No obstetric history on file.      Home Medications    Prior to Admission medications   Medication Sig Start Date End Date Taking? Authorizing Provider  lisinopril (ZESTRIL) 5 MG tablet Take 5 mg by mouth daily.   Yes [provider]  metFORMIN (GLUCOPHAGE) 1000 MG tablet Take 1,000 mg by mouth daily with breakfast.   Yes [provider]  hydrochlorothiazide (HYDRODIURIL) 25 MG tablet Take 1 tablet (25 mg total) by mouth daily. 06/28/13   Earley Favor, NP  naproxen sodium (ANAPROX) 220 MG tablet Take 440 mg by mouth 2 (two) times daily as needed (for pain).    [provider]  neomycin-polymyxin-hydrocortisone (CORTISPORIN) OTIC solution Place 3 drops into the right ear 4 (four) times daily. 12/06/19   Hall-Potvin, [place], PA-C    Family History Family History  Problem Relation Age of Onset  . Diabetes Father     Social History Social History   Tobacco Use  . Smoking status: Former Games developer  . Smokeless tobacco: Never Used  Substance Use Topics  . Alcohol use: Yes  . Drug use: No     Allergies   Patient has no known allergies.   Review of Systems As per HPI   Physical Exam Triage  Vital Signs ED Triage Vitals  Enc Vitals Group     BP      Pulse      Resp      Temp      Temp src      SpO2      Weight      Height      Head Circumference      Peak Flow      Pain Score      Pain Loc      Pain Edu?      Excl. in GC?    No data found.  Updated Vital Signs BP (!) 150/88 (BP Location: Left Arm)   Pulse 82   Temp 97.8 F (36.6 C) (Temporal)   Resp 16   LMP 11/28/2019   SpO2 98%   Visual Acuity Right Eye Distance:   Left Eye Distance:   Bilateral Distance:    Right Eye Near:   Left Eye Near:    Bilateral Near:     Physical Exam Constitutional:      General: She is not in acute distress. HENT:     Head: Normocephalic and atraumatic.     Jaw: There is normal jaw occlusion. No tenderness or pain on movement.     Right Ear: Hearing, tympanic membrane and external ear normal. No tenderness. No middle ear effusion. No mastoid tenderness. Tympanic membrane  is not erythematous.     Left Ear: Hearing, tympanic membrane, ear canal and external ear normal. No tenderness. No mastoid tenderness.     Ears:     Comments: Mild right tragal tenderness.  EAC minimally edematous with moderate injection.  No discharge, TM perforation, foreign body    Nose: No nasal deformity, septal deviation, nasal tenderness, congestion or rhinorrhea.     Right Turbinates: Not swollen or pale.     Left Turbinates: Not swollen or pale.     Right Sinus: No maxillary sinus tenderness or frontal sinus tenderness.     Left Sinus: No maxillary sinus tenderness or frontal sinus tenderness.     Mouth/Throat:     Lips: Pink. No lesions.     Mouth: Mucous membranes are moist. No injury or oral lesions.     Pharynx: Oropharynx is clear. Uvula midline. No posterior oropharyngeal erythema or uvula swelling.     Comments: Right tonsil has minimal injection without exudate.  Tonsils 2+ bilaterally Eyes:     Conjunctiva/sclera: Conjunctivae normal.     Pupils: Pupils are equal, round, and  reactive to light.  Neck:     Comments: Positive for right-sided anterior chain cervical LAD that is mildly tender Cardiovascular:     Rate and Rhythm: Normal rate.  Pulmonary:     Effort: Pulmonary effort is normal.  Musculoskeletal:     Cervical back: Normal range of motion and neck supple. No muscular tenderness.  Lymphadenopathy:     Cervical: Cervical adenopathy present.  Neurological:     Mental Status: She is alert and oriented to person, place, and time.      UC Treatments / Results  Labs (all labs ordered are listed, but only abnormal results are displayed) Labs Reviewed - No data to display  EKG   Radiology No results found.  Procedures Procedures (including critical Weaver time)  Medications Ordered in UC Medications - No data to display  Initial Impression / Assessment and Plan / UC Course  I have reviewed the triage vital signs and the nursing notes.  Pertinent labs & imaging results that were available during my Weaver of the patient were reviewed by me and considered in my medical decision making (see chart for details).    I have reviewed the triage vital signs and the nursing notes.  All pertinent labs & imaging results that were available during my Weaver of the patient were reviewed by me and considered in my medical decision making (see chart for details).  Patient afebrile, nontoxic today in office.  Exam concerning for acute right otitis externa.  Less likely streptococcal pharyngitis: Rapid strep deferred.  No additional URI symptoms or known Covid exposures, and patient already tested positive in December-repeat testing deferred at this time.  Will treat with Cortisporin as outlined below.  Return precautions discussed, patient verbalized understanding and is agreeable to plan. Final Clinical Impressions(s) / UC Diagnoses   Final diagnoses:  Acute otitis externa of right ear, unspecified type     Discharge Instructions     Use eardrops as  prescribed for the next week. Return for worsening ear pain, swelling, discharge, bleeding, decreased hearing, development of jaw pain/swelling, fever.  Do NOT use Q-tips as these can cause your ear wax to get stuck, the tips may break off and become a foreign body requiring additional medical Weaver, or puncture your eardrum.  Helpful prevention tip: Use a solution of equal parts isopropyl (rubbing) alcohol and white vinegar (acetic acid)  in both ears after swimming.    ED Prescriptions    Medication Sig Dispense Auth. Provider   neomycin-polymyxin-hydrocortisone (CORTISPORIN) OTIC solution Place 3 drops into the right ear 4 (four) times daily. 10 mL Hall-Potvin, [place], PA-C     PDMP not reviewed this encounter.   Hall-Potvin, [place], [place] 12/06/19 1108

## 2019-12-06 NOTE — ED Triage Notes (Signed)
Pt presents to [facility] for assessment of right sore throat and ear pain starting yesterday and worsening this morning.  Positive for COVID in December, no test done today.

## 2021-11-29 ENCOUNTER — Ambulatory Visit: Payer: Self-pay

## 2021-12-13 ENCOUNTER — Ambulatory Visit: Payer: Self-pay

## 2021-12-13 ENCOUNTER — Other Ambulatory Visit: Payer: Self-pay

## 2021-12-13 ENCOUNTER — Ambulatory Visit (INDEPENDENT_AMBULATORY_CARE_PROVIDER_SITE_OTHER): Payer: 59

## 2021-12-13 ENCOUNTER — Ambulatory Visit
Admission: RE | Admit: 2021-12-13 | Discharge: 2021-12-13 | Disposition: A | Discharge status: Home / Self Care | Payer: 59 | Source: Ambulatory Visit | Attending: Physician Assistant | Admitting: Physician Assistant

## 2021-12-13 VITALS — BP 150/88 | HR 87 | Temp 98.9°F | Resp 18

## 2021-12-13 DIAGNOSIS — M25572 Pain in left ankle and joints of left foot: Secondary | ICD-10-CM

## 2021-12-13 NOTE — ED Provider Notes (Signed)
[name]    CSN: [id number] Arrival date & time: 12/13/21  1143      History   Chief Complaint Chief Complaint  Patient presents with   left ankle pain    HPI Ashley Weaver is a 46 y.o. female.   Patient here today for evaluation of left ankle pain that started when she was walking out of work yesterday. She reports pain with movement of her ankle and weight bearing this morning. She does admit to missing some of her doses of HTN treatment this week and is not sure if fluid accumulation is part of her issue. She denies any known injury. She does not have any calf pain. She does have PMH significant for DM and HTN. She has not tried any treatment for symptoms.   The history is provided by the patient.   Past Medical History:  Diagnosis Date   Diabetes mellitus without complication (HCC)    Hypertension     There are no problems to display for this patient.   Past Surgical History:  Procedure Laterality Date   CHOLECYSTECTOMY      OB History   No obstetric history on file.      Home Medications    Prior to Admission medications   Medication Sig Start Date End Date Taking? Authorizing Provider  hydrochlorothiazide (HYDRODIURIL) 25 MG tablet Take 1 tablet (25 mg total) by mouth daily. 06/28/13   Earley Favor, NP  lisinopril (ZESTRIL) 5 MG tablet Take 5 mg by mouth daily.    [provider]  metFORMIN (GLUCOPHAGE) 1000 MG tablet Take 1,000 mg by mouth daily with breakfast.    [provider]  naproxen sodium (ANAPROX) 220 MG tablet Take 440 mg by mouth 2 (two) times daily as needed (for pain).    [provider]  neomycin-polymyxin-hydrocortisone (CORTISPORIN) OTIC solution Place 3 drops into the right ear 4 (four) times daily. 12/06/19   Hall-Potvin, [place], PA-C    Family History Family History  Problem Relation Age of Onset   Diabetes Father     Social History Social History   Tobacco Use   Smoking status:  Former   Smokeless tobacco: Never  Substance Use Topics   Alcohol use: Yes   Drug use: No     Allergies   Patient has no known allergies.   Review of Systems Review of Systems  Constitutional:  Negative for chills and fever.  Eyes:  Negative for discharge and redness.  Gastrointestinal:  Negative for abdominal pain, nausea and vomiting.  Musculoskeletal:  Positive for arthralgias.  Skin:  Negative for color change and wound.  Neurological:  Negative for numbness.    Physical Exam Triage Vital Signs ED Triage Vitals [12/13/21 1156]  Enc Vitals Group     BP (!) 150/88     Pulse Rate 87     Resp 18     Temp 98.9 F (37.2 C)     Temp Source Oral     SpO2 98 %     Weight      Height      Head Circumference      Peak Flow      Pain Score 0     Pain Loc      Pain Edu?      Excl. in GC?    No data found.  Updated Vital Signs BP (!) 150/88 (BP Location: Left Arm)    Pulse 87    Temp 98.9 F (37.2 C) (  Oral)    Resp 18    SpO2 98%   Physical Exam Vitals and nursing note reviewed.  Constitutional:      General: She is not in acute distress.    Appearance: Normal appearance. She is not ill-appearing.  HENT:     Head: Normocephalic and atraumatic.  Eyes:     Conjunctiva/sclera: Conjunctivae normal.  Cardiovascular:     Rate and Rhythm: Normal rate.  Pulmonary:     Effort: Pulmonary effort is normal.  Musculoskeletal:     Comments: Diffuse edema noted to bilateral ankles. Mild decreased ROM of left ankle due to pain. No erythema or color changes noted  Neurological:     Mental Status: She is alert.  Psychiatric:        Mood and Affect: Mood normal.        Behavior: Behavior normal.        Thought Content: Thought content normal.     UC Treatments / Results  Labs (all labs ordered are listed, but only abnormal results are displayed) Labs Reviewed - No data to display  EKG   Radiology DG Ankle Complete Left  Result Date: 12/13/2021 CLINICAL DATA:   Lambert Mody LEFT ankle pain in the absence of trauma in a 46 year old female. EXAM: LEFT ANKLE COMPLETE - 3+ VIEW COMPARISON:  February of 2008. FINDINGS: Substantial soft tissue swelling. This is seen above the medial and lateral ankle and generalized throughout the visible lower extremity. Calcifications are noted in the soft tissues likely related to venous calcifications. No signs of ankle effusion.  Ankle mortise is intact. IMPRESSION: 1. Substantial soft tissue swelling about the ankle and generalized lower extremity. No underlying osseous abnormality. Correlate with any signs of venous stasis or infection. 2. Vascular calcifications. Electronically Signed   By: Donzetta Kohut M.D.   On: 12/13/2021 12:17    Procedures Procedures (including critical care time)  Medications Ordered in UC Medications - No data to display  Initial Impression / Assessment and Plan / UC Course  I have reviewed the triage vital signs and the nursing notes.  Pertinent labs & imaging results that were available during my care of the patient were reviewed by me and considered in my medical decision making (see chart for details).    Recommended patient trial ibuprofen for symptoms and encouraged follow up in ED if symptoms persist or worsen to rule out DVT. At this time patient is not showing classic signs or symptoms of DVT so low suspicion for same.   Final Clinical Impressions(s) / UC Diagnoses   Final diagnoses:  Acute left ankle pain   Discharge Instructions   None    ED Prescriptions   None    PDMP not reviewed this encounter.   Tomi Bamberger, PA-C 12/13/21 1236

## 2021-12-13 NOTE — ED Triage Notes (Signed)
Pt c/o left ankle pain, sharp, onset yesterday at work. Denies any direct trauma or injury to the area. Pain is constant.

## 2021-12-18 ENCOUNTER — Emergency Department (HOSPITAL_BASED_OUTPATIENT_CLINIC_OR_DEPARTMENT_OTHER): Payer: 59

## 2021-12-18 ENCOUNTER — Encounter (HOSPITAL_COMMUNITY): Payer: Self-pay | Admitting: Emergency Medicine

## 2021-12-18 ENCOUNTER — Emergency Department (HOSPITAL_COMMUNITY)
Admission: EM | Admit: 2021-12-18 | Discharge: 2021-12-19 | Disposition: A | Discharge status: Home / Self Care | Payer: 59 | Attending: Emergency Medicine | Admitting: Emergency Medicine

## 2021-12-18 DIAGNOSIS — Z79899 Other long term (current) drug therapy: Secondary | ICD-10-CM | POA: Insufficient documentation

## 2021-12-18 DIAGNOSIS — M79605 Pain in left leg: Secondary | ICD-10-CM | POA: Diagnosis not present

## 2021-12-18 DIAGNOSIS — S93402A Sprain of unspecified ligament of left ankle, initial encounter: Secondary | ICD-10-CM | POA: Diagnosis not present

## 2021-12-18 DIAGNOSIS — R2242 Localized swelling, mass and lump, left lower limb: Secondary | ICD-10-CM | POA: Diagnosis not present

## 2021-12-18 DIAGNOSIS — M25472 Effusion, left ankle: Secondary | ICD-10-CM

## 2021-12-18 DIAGNOSIS — Z7984 Long term (current) use of oral hypoglycemic drugs: Secondary | ICD-10-CM | POA: Diagnosis not present

## 2021-12-18 DIAGNOSIS — S99912A Unspecified injury of left ankle, initial encounter: Secondary | ICD-10-CM | POA: Diagnosis present

## 2021-12-18 DIAGNOSIS — X58XXXA Exposure to other specified factors, initial encounter: Secondary | ICD-10-CM | POA: Diagnosis not present

## 2021-12-18 DIAGNOSIS — Y9301 Activity, walking, marching and hiking: Secondary | ICD-10-CM | POA: Diagnosis not present

## 2021-12-18 MED ORDER — IBUPROFEN 800 MG PO TABS
800.0000 mg | ORAL_TABLET | Freq: Once | ORAL | Status: AC
  Administered 2021-12-18: 800 mg via ORAL
  Filled 2021-12-18: qty 1

## 2021-12-18 NOTE — ED Triage Notes (Signed)
Patient here with complaint of posterior left lower leg pain that started Thursday approximately one week ago, seen at urgent care for same several days ago and has x-ray that showed no fracture. Patient here for evaluation with continued pain in same location.

## 2021-12-18 NOTE — Discharge Instructions (Addendum)
You were evaluated in the Emergency Department and after careful evaluation, we did not find any emergent condition requiring admission or further testing in the hospital.  Your exam/testing today was overall reassuring.  Your x-ray showed no evidence of acute fracture during your previous urgent care visit.  Your ultrasound showed no evidence of a blood clot in your leg.  I have low suspicion for infection in your ankle joint.  Symptoms are more consistent with a possible ruptured cyst versus ankle sprain.  Recommend rest, ice, elevation of the extremity, NSAIDs for pain control and follow-up with sports medicine for repeat assessment within a week.  Please return to the Emergency Department if you experience any worsening of your condition.  Thank you for allowing [place] to be a part of your care.

## 2021-12-18 NOTE — Progress Notes (Signed)
LLE venous duplex has been completed.  Preliminary results messaged to [employer], PA-C.   Results can be found under chart review under CV PROC. 12/18/2021 3:45 PM Najae Rathert RVT, RDMS

## 2021-12-18 NOTE — Progress Notes (Signed)
Orthopedic Tech Progress Note Patient Details:  Ashley Weaver 02-Jul-1976 [id number]  Ortho Devices Type of Ortho Device: Crutches, CAM walker Ortho Device/Splint Location: LLE Ortho Device/Splint Interventions: Ordered, Application, Adjustment   Post Interventions Patient Tolerated: Well Instructions Provided: Adjustment of device, Care of device, Poper ambulation with device  Ashley Weaver 12/18/2021, 11:40 PM

## 2021-12-18 NOTE — ED Provider Notes (Signed)
[facility] Provider Note   CSN: [id number] Arrival date & time: 12/18/21  1349     History  Chief Complaint  Patient presents with   Ankle Pain    Ashley Weaver is a 46 y.o. female.   Ankle Pain   46 year old female presenting to the emergency department with left ankle pain. Symptoms began one week ago. She was walking and felt a sudden pain in her left ankle with subsequent swelling. She denied any knee pain at that time. She endorses persistent pain with ambulation. She is able to range the ankle joint itself without trouble. She denies any fevers, chills, or redness around the joint.   She was seen at urgent care several days ago where an XR did not show evidence of acute fracture. She does not recall actually injuring her ankle or foot. Swelling has persisted and she was concerned for possible DVT so she presented to the ED for further care and management.   Home Medications Prior to Admission medications   Medication Sig Start Date End Date Taking? Authorizing Provider  hydrochlorothiazide (HYDRODIURIL) 25 MG tablet Take 1 tablet (25 mg total) by mouth daily. 06/28/13   Earley FavorSchulz, Gail, NP  lisinopril (ZESTRIL) 5 MG tablet Take 5 mg by mouth daily.    [provider]  metFORMIN (GLUCOPHAGE) 1000 MG tablet Take 1,000 mg by mouth daily with breakfast.    [provider]  naproxen sodium (ANAPROX) 220 MG tablet Take 440 mg by mouth 2 (two) times daily as needed (for pain).    [provider]  neomycin-polymyxin-hydrocortisone (CORTISPORIN) OTIC solution Place 3 drops into the right ear 4 (four) times daily. 12/06/19   Hall-Potvin, [place], PA-C      Allergies    Patient has no known allergies.    Review of Systems   Review of Systems  Musculoskeletal:  Positive for arthralgias and joint swelling.  All other systems reviewed and are negative.  Physical Exam Updated Vital Signs BP 127/86    Pulse 81    Temp  97.8 F (36.6 C) (Oral)    Resp 16    SpO2 100%  Physical Exam Vitals and nursing note reviewed.  Constitutional:      General: She is not in acute distress. HENT:     Head: Normocephalic and atraumatic.  Eyes:     Conjunctiva/sclera: Conjunctivae normal.     Pupils: Pupils are equal, round, and reactive to light.  Cardiovascular:     Rate and Rhythm: Normal rate and regular rhythm.     Comments: 2+ DP pulses bilaterally Pulmonary:     Effort: Pulmonary effort is normal. No respiratory distress.  Abdominal:     General: There is no distension.     Tenderness: There is no guarding.  Musculoskeletal:        General: Swelling and tenderness present. No deformity or signs of injury.     Cervical back: Neck supple.     Comments: Tenderness to the lateral aspect of the left ankle, associated swelling, no ecchymoses or erythema. ROM of the left ankle without pain.   Skin:    Findings: No lesion or rash.  Neurological:     General: No focal deficit present.     Mental Status: She is alert. Mental status is at baseline.    ED Results / Procedures / Treatments   Labs (all labs ordered are listed, but only abnormal results are displayed) Labs Reviewed - No data  to display  EKG None  Radiology VAS [place] LOWER EXTREMITY VENOUS (DVT) (ONLY [facility] & [facility])  Result Date: 12/18/2021  Lower Venous DVT Study Patient Name:  Ashley Weaver  Date of Exam:   12/18/2021 Medical Rec #: [id number]         Accession #:    [id number] Date of Birth: 03/10/1976         Patient Gender: F Patient Age:   23 years Exam Location:  [facility] Procedure:      VAS [place] LOWER EXTREMITY VENOUS (DVT) Referring Phys: Honor Loh --------------------------------------------------------------------------------  Indications: Pain.  Comparison Study: No previous exams Performing Technologist: Jody Hill RVT, RDMS  Examination Guidelines: A complete evaluation includes B-mode imaging, spectral Doppler, color Doppler, and  power Doppler as needed of all accessible portions of each vessel. Bilateral testing is considered an integral part of a complete examination. Limited examinations for reoccurring indications may be performed as noted. The reflux portion of the exam is performed with the patient in reverse Trendelenburg.  +-----+---------------+---------+-----------+----------+--------------+  RIGHT Compressibility Phasicity Spontaneity Properties Thrombus Aging  +-----+---------------+---------+-----------+----------+--------------+  CFV   Full            Yes       Yes                                    +-----+---------------+---------+-----------+----------+--------------+   +---------+---------------+---------+-----------+----------+--------------+  LEFT      Compressibility Phasicity Spontaneity Properties Thrombus Aging  +---------+---------------+---------+-----------+----------+--------------+  CFV       Full            Yes       Yes                                    +---------+---------------+---------+-----------+----------+--------------+  SFJ       Full                                                             +---------+---------------+---------+-----------+----------+--------------+  FV Prox   Full            Yes       Yes                                    +---------+---------------+---------+-----------+----------+--------------+  FV Mid    Full            Yes       Yes                                    +---------+---------------+---------+-----------+----------+--------------+  FV Distal Full            Yes       Yes                                    +---------+---------------+---------+-----------+----------+--------------+  PFV       Full                                                             +---------+---------------+---------+-----------+----------+--------------+  POP       Full            Yes       Yes                                     +---------+---------------+---------+-----------+----------+--------------+  PTV       Full                                                             +---------+---------------+---------+-----------+----------+--------------+  PERO      Full                                                             +---------+---------------+---------+-----------+----------+--------------+     Summary: RIGHT: - No evidence of common femoral vein obstruction.  LEFT: - There is no evidence of deep vein thrombosis in the lower extremity. - There is no evidence of superficial venous thrombosis.  - Ultrasound characteristics of a ruptured Baker's Cyst are noted.  *See table(s) above for measurements and observations. Electronically signed by Coral Else MD on 12/18/2021 at 8:56:45 PM.    Final     Procedures Procedures    Medications Ordered in ED Medications  ibuprofen (ADVIL) tablet 800 mg (800 mg Oral Given 12/18/21 2332)    ED Course/ Medical Decision Making/ A&P                           Medical Decision Making Risk Prescription drug management.   46 year old female presenting to the emergency department with left ankle pain. Symptoms began one week ago. She was walking and felt a sudden pain in her left ankle with subsequent swelling. She denied any knee pain at that time. She endorses persistent pain with ambulation. She is able to range the ankle joint itself without trouble. She denies any fevers, chills, or redness around the joint.   She was seen at urgent care several days ago where an XR did not show evidence of acute fracture. She does not recall actually injuring her ankle or foot. Swelling has persisted and she was concerned for possible DVT so she presented to the ED for further care and management.   On arrival the patient was vitally stable. Physical exam with swelling of the left ankle with intact ROM without pain. No erythema. Low concern for cellulitis or septic arthritis. Low suspicion for  a gout flare. Patient recently had negative XR imaging. Suspect likely ankle sprain. Patient is concerned for possible DVT US and this was ordered in triage. [place] was negative for DVT, did show findings concerning for a ruptured Baker's cyst. Informed the patient of the results. Findings could be due to ruptured bakers cyst or ligamentous injury sustained while walking. Patient with pain with ambulation. Will provide CAM boot and have the patient follow-up with sports medicine for further assessment. Patient could benefit from outpatient MRI imaging to assess for possible ligamentous injury. Advised RICE protocol. Stable for discharge.  DC Instructions: Your exam/testing today was overall reassuring.  Your x-ray showed no evidence of acute fracture during your previous urgent care visit.  Your ultrasound showed no evidence of a blood clot in your leg.  I have low suspicion for infection in your ankle joint.  Symptoms are more consistent with a possible ruptured cyst versus ankle sprain.  Recommend rest, ice, elevation of the extremity, NSAIDs for pain control and follow-up with sports medicine for repeat assessment within a week.   Final Clinical Impression(s) / ED Diagnoses Final diagnoses:  Sprain of left ankle, unspecified ligament, initial encounter  Left ankle swelling    Rx / DC Orders ED Discharge Orders          Ordered    AMB referral to sports medicine        12/18/21 2305              Ernie Avena, MD 12/19/21 1850

## 2021-12-18 NOTE — ED Provider Triage Note (Signed)
Emergency Medicine Provider Triage Evaluation Note  Ashley Weaver , a 46 y.o. female  was evaluated in triage.  Pt complains of lower ankle pain and swelling.  Was seen in urgent care and had a negative x-ray.  Does not recall any trauma or injury.  No chest pain or shortness of breath.  No estrogen use.  Review of Systems  Positive:  Negative: See above   Physical Exam  BP (!) 154/95 (BP Location: Right Arm)    Pulse 89    Temp 97.7 F (36.5 C) (Oral)    Resp 16    SpO2 100%  Gen:   Awake, no distress   Resp:  Normal effort  MSK:   Moves extremities without difficulty  Other:  No calf tenderness.  There is moderate left ankle swelling.  No obvious tenderness.  Medical Decision Making  Medically screening exam initiated at 2:06 PM.  Appropriate orders placed.  Ashley Weaver was informed that the remainder of the evaluation will be completed by another provider, this initial triage assessment does not replace that evaluation, and the importance of remaining in the ED until their evaluation is complete.     Honor Loh Diaz, [place] 12/18/21 1407

## 2021-12-18 NOTE — ED Notes (Signed)
Pt not in room when this RN went to d/c pt.

## 2021-12-20 NOTE — Progress Notes (Signed)
Subjective:    CC: L ankle pain  I, Ashley Weaver, LAT, ATC, am serving as scribe for Dr. [name].  HPI: Pt is a 46 y/o female presenting w/ c/o L ankle pain that began on 12/13/21.  She states that she felt pain in her L ankle while walking toward the time clock and felt pain in her L ankle.  She was seen at the [facility] on 12/13/21 and then again at the [facility] ED on 12/18/21.  She locates her pain to her L medial and ant ankle and L Achille's .  L  ankle swelling: yes initially but now only at the end of the day Aggravating factors: walking/weight-bearing activity; L foot inv/ev AROM Treatments tried: IBU; heat; ice; walking boot; crutches  Diagnostic testing: L ankle XR- 12/13/21  Pertinent review of Systems: no fever or chills  Relevant historical information: Diabetes and hypertension. 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  She did not slip.   Objective:    Vitals:   12/23/21 0906  BP: 130/88  Pulse: 88  SpO2: 100%   General: Well Developed, well nourished, and in no acute distress.   MSK:  Left calf mild swelling. Left ankle effusion and swollen. Tender palpation along medial ankle at medial malleolus. Decreased ankle motion. Pain with foot dorsiflexion.  Pain with foot inversion and plantarflexion. Pain with resisted foot inversion and resisted foot dorsiflexion plantarflexion. Strength is intact. Pulses capillary fill and sensation are intact distally.  Lab and Radiology Results  Diagnostic Limited MSK Ultrasound of: Left ankle Achilles tendon is intact.  No rupture present Medial ankle structures.. Posterior tibialis tendon is intact. Hypoechoic fluid tracks within posterior tibialis tendon sheath just distal to the medial malleolus indicating tenosynovitis. Other posterior ankle structures normal-appearing Impression: Posterior tibialis tendinitis.  EXAM: LEFT ANKLE COMPLETE - 3+ VIEW   COMPARISON:   February of 2008.   FINDINGS: Substantial soft tissue swelling. This is seen above the medial and lateral ankle and generalized throughout the visible lower extremity. Calcifications are noted in the soft tissues likely related to venous calcifications.   No signs of ankle effusion.  Ankle mortise is intact.   IMPRESSION: 1. Substantial soft tissue swelling about the ankle and generalized lower extremity. No underlying osseous abnormality. Correlate with any signs of venous stasis or infection. 2. Vascular calcifications.     Electronically Signed   By: Donzetta Kohut M.D.   On: 12/13/2021 12:17  Vascular ultrasound report from December 18, 2021    Summary:  RIGHT:  - No evidence of common femoral vein obstruction.     LEFT:  - There is no evidence of deep vein thrombosis in the lower extremity.  - There is no evidence of superficial venous thrombosis.     - Ultrasound characteristics of a ruptured Baker's Cyst are noted.      *See table(s) above for measurements and observations.  Electronically signed by Coral Else MD on 12/18/2021 at 8:56:45 PM.   I, [name], personally (independently) visualized and performed the interpretation of the images attached in this note. Agree with ultrasound findings showing Baker's cyst posterior knee.   Impression and Recommendations:    Assessment and Plan: 46 y.o. female with left ankle pain.  Patient developed sudden left ankle and leg pain and swelling while walking. Vascular ultrasound shows a Baker's cyst with evidence of rupture.  I think some of her pain is multifactorial.  I think she  did have a ruptured Baker's cyst which Would cause some leg swelling.  I think she has evidence of posterior tibialis tendinitis which would cause some medial ankle pain and swelling.  I think is also possible that she may have gout is a possibility.  Plan to continue cam walker boot.  Add colchicine.  If this does not help we will prescribe  prednisone.  Recheck in 3 weeks. Currently out of work.  Work note provided.  Main treatment for now will be physical therapy.  PDMP not reviewed this encounter. Orders Placed This Encounter  Procedures   [place] LIMITED JOINT SPACE STRUCTURES LOW LEFT(NO LINKED CHARGES)    Order Specific Question:   Reason for Exam (SYMPTOM  OR DIAGNOSIS REQUIRED)    Answer:   L ankle pain    Order Specific Question:   Preferred imaging location?    Answer:   [facility] [employer] metabolic panel    Standing Status:   Future    Number of Occurrences:   1    Standing Expiration Date:   12/23/2022   Uric acid    Standing Status:   Future    Number of Occurrences:   1    Standing Expiration Date:   12/23/2022   Ambulatory referral to Physical Therapy    Referral Priority:   Routine    Referral Type:   Physical Medicine    Referral Reason:   Specialty Services Required    Requested Specialty:   Physical Therapy    Number of Visits Requested:   1   Meds ordered this encounter  Medications   colchicine 0.6 MG tablet    Sig: Take 1 tablet (0.6 mg total) by mouth daily.    Dispense:  30 tablet    Refill:  3    Discussed warning signs or symptoms. Please see discharge instructions. Patient expresses understanding.   The above documentation has been reviewed and is accurate and complete [name], M.D.

## 2021-12-23 ENCOUNTER — Ambulatory Visit (INDEPENDENT_AMBULATORY_CARE_PROVIDER_SITE_OTHER): Payer: 59 | Admitting: Family Medicine

## 2021-12-23 ENCOUNTER — Other Ambulatory Visit: Payer: Self-pay

## 2021-12-23 ENCOUNTER — Encounter: Payer: Self-pay | Admitting: Family Medicine

## 2021-12-23 ENCOUNTER — Ambulatory Visit: Payer: Self-pay

## 2021-12-23 VITALS — BP 130/88 | HR 88 | Ht 65.0 in | Wt 238.8 lb

## 2021-12-23 DIAGNOSIS — M25572 Pain in left ankle and joints of left foot: Secondary | ICD-10-CM

## 2021-12-23 DIAGNOSIS — E139 Other specified diabetes mellitus without complications: Secondary | ICD-10-CM | POA: Insufficient documentation

## 2021-12-23 MED ORDER — COLCHICINE 0.6 MG PO TABS: 0.6000 mg | ORAL_TABLET | Freq: Every day | ORAL | 3 refills | Status: DC

## 2021-12-23 NOTE — Patient Instructions (Addendum)
Good to see you today.  I've referred you to Physical Therapy.  Let us know if you don't hear from them in one week.    Please perform the exercise program that we have prepared for you and gone over in detail on a daily basis.  In addition to the handout you were provided you can access your program through: [email or web address]   Your unique program code is:  [email or web address]   I sent the prescription of colchicine to your pharmacy  Please get labs today before you leave.   Follow-up: 3 weeks

## 2021-12-24 LAB — BASIC METABOLIC PANEL
BUN: 12 mg/dL (ref 6–23)
CO2: 32 mEq/L (ref 19–32)
Calcium: 8.9 mg/dL (ref 8.4–10.5)
Chloride: 102 mEq/L (ref 96–112)
Creatinine, Ser: 0.75 mg/dL (ref 0.40–1.20)
GFR: 96.11 mL/min (ref >60.00)
Glucose, Bld: 99 mg/dL (ref 70–99)
Potassium: 4.1 mEq/L (ref 3.5–5.1)
Sodium: 137 mEq/L (ref 135–145)

## 2021-12-24 LAB — URIC ACID: Uric Acid, Serum: 4.1 mg/dL (ref 2.4–7.0)

## 2021-12-25 ENCOUNTER — Other Ambulatory Visit: Payer: Self-pay

## 2021-12-25 MED ORDER — PREDNISONE 50 MG PO TABS: ORAL_TABLET | ORAL | 0 refills | Status: DC

## 2021-12-25 NOTE — Progress Notes (Signed)
Uric acid level is normal.  Kidney function is normal.  I do not think colchicine is going to be very helpful.  I can prescribe prednisone if you would like me to.  That may be more helpful.

## 2021-12-25 NOTE — Progress Notes (Signed)
Pt returned VM and Dr. Zollie Pee recommendations were re-enforced. Pt stated that she would like to try the prednisone. Rx was sent to her pharmacy. Pt verbalized understanding.

## 2021-12-31 ENCOUNTER — Encounter: Payer: Self-pay | Admitting: Physical Therapy

## 2021-12-31 ENCOUNTER — Ambulatory Visit (INDEPENDENT_AMBULATORY_CARE_PROVIDER_SITE_OTHER): Payer: 59 | Admitting: Physical Therapy

## 2021-12-31 ENCOUNTER — Other Ambulatory Visit: Payer: Self-pay

## 2021-12-31 DIAGNOSIS — M25572 Pain in left ankle and joints of left foot: Secondary | ICD-10-CM | POA: Diagnosis not present

## 2021-12-31 DIAGNOSIS — M25672 Stiffness of left ankle, not elsewhere classified: Secondary | ICD-10-CM

## 2021-12-31 DIAGNOSIS — R262 Difficulty in walking, not elsewhere classified: Secondary | ICD-10-CM

## 2021-12-31 DIAGNOSIS — R600 Localized edema: Secondary | ICD-10-CM | POA: Diagnosis not present

## 2021-12-31 NOTE — Therapy (Signed)
OUTPATIENT PHYSICAL THERAPY LOWER EXTREMITY EVALUATION   Patient Name: Ashley Weaver MRN: [id number] DOB:[birth year], 46 y.o., female Today's Date: 12/31/2021   PT End of Session - 12/31/21 1039     Visit Number 1    Number of Visits 13    Date for PT Re-Evaluation 02/11/22    PT Start Time 0930    PT Stop Time 1017    PT Time Calculation (min) 47 min    Activity Tolerance Patient tolerated treatment well    Behavior During Therapy [facility] for tasks assessed/performed             Past Medical History:  Diagnosis Date   Diabetes mellitus without complication (HCC)    Hypertension    Past Surgical History:  Procedure Laterality Date   CHOLECYSTECTOMY     Patient Active Problem List   Diagnosis Date Noted   Diabetes 1.5, 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   Morbid obesity (HCC) 03/08/2019   High blood pressure 11/22/2017    PCP: Pcp, No  REFERRING PROVIDER: Rodolph Bong, MD  REFERRING DIAG: [phone] (ICD-10-CM) - Acute left ankle pain   THERAPY DIAG:  Pain in left ankle and joints of left foot  Stiffness of left ankle, not elsewhere classified  Localized edema  Difficulty in walking, not elsewhere classified  ONSET DATE: 12/13/2021  SUBJECTIVE:   SUBJECTIVE STATEMENT: Pt presenting w/ c/o L ankle pain that began on 12/13/21.  She states that she felt pain in her L ankle while walking toward the time clock and felt pain in her L ankle.  She was seen at the [facility] on 12/13/21 and then again at the [facility] ED on 12/18/21.  She locates her pain to her left medial and anterior ankle and left Achille's .  Pt wearing a CAM walking boot and using single axillary crutch for amb. Pt stating it's hard to walk without support. Pt stating she was instructed to wean off crutches and boot as she tolerates. Pt stating she is walking short distances without her boot and crutches but pain increased.   PERTINENT HISTORY: Pt with left Bakers cyst rupture per vascular  ultrasound PMH: DM, HTN  PAIN:  Are you having pain? Yes NPRS scale: 5/10 Pain location: left posterior ankle  Pain orientation: Left  PAIN TYPE: aching and sharp Pain description: constant  Aggravating factors: walking, wt bearing  Relieving factors: sleeping, resting, prednisone  PRECAUTIONS: None  WEIGHT BEARING RESTRICTIONS No  FALLS:  Has patient fallen in last 6 months? no, Number of falls: 0  LIVING ENVIRONMENT: Lives with: lives with their family and lives with their spouse Lives in: House/apartment Stairs: Yes; Internal: 14 steps; on right going up Has following equipment at home: Crutches  OCCUPATION: saw operator  PLOF: Independent  PATIENT GOALS get back to work with no pain in her left ankle/foot   OBJECTIVE:   DIAGNOSTIC FINDINGS: ruptured baker's cyst left knee,   PATIENT SURVEYS:   12/31/2021: FOTO 37%, predicted 68%  COGNITION:  Overall cognitive status: Within functional limits for tasks assessed     SENSATION:  Light touch: Appears intact   POSTURE:  Rounded shoulder, forward head  PALPATION: TTP; posterior ankle, medial ankle   EDEMA:  Rt ankle circumference  29 centimeters Left ankle circumference 31 centimeters  LE AROM/PROM:  AROM supine Right 12/31/2021 Left 12/31/2021  Knee flexion 126 128  Knee extension -2 hyperextension -2 hyperextension  Ankle dorsiflexion 8 4  Ankle plantarflexion 20 10  Ankle inversion 25 12  Ankle eversion 30 15   (Blank rows = not tested)  PROM supine Right 12/31/2021 Left 12/31/2021  Knee flexion    Knee extension    Ankle dorsiflexion 12 6  Ankle plantarflexion 24 12  Ankle inversion 28 18  Ankle eversion 34 20     LE MMT:  MMT Right 12/31/2021 Left 12/31/2021  Hip flexion 5/5 5/5  Hip extension    Hip abduction 5/5 5/5  Hip adduction 5/5 5/5  Knee flexion 5/5 5/5  Knee extension 5/5 5/5  Ankle dorsiflexion 5/5 3/5  Ankle plantarflexion 5/5 3/5  Ankle inversion 5/5 3/5  Ankle  eversion 5/5 3/5   (Blank rows = not tested)    FUNCTIONAL TESTS:  5 times sit to stand: 22 seconds with bilateral UE support  GAIT: Distance walked: 50 feet level surfaces Assistive device utilized: Crutches single Level of assistance: Modified independence Comments: CAM boot left foot  TODAY'S TREATMENT: 12/31/2021  Therapeutic Exercise:  Aerobic: Supine: Prone:  Seated: df stretches x 1 holding 30 seconds   Towel scrunches x 2    ABC's with ankle x 1   Towel inversion/eversion   Ankle pumps x 10    Standing: Neuromuscular Re-education: Manual Therapy: Therapeutic Activity: Self Care: Trigger Point Dry Needling:  Modalities:     PATIENT EDUCATION:  Education details: PT POC, HEP, issued DN handout Person educated: Patient Education method: Explanation, Demonstration, Tactile cues, Verbal cues, and Handouts Education comprehension: verbalized understanding and returned demonstration   HOME EXERCISE PROGRAM: Access Code: [email or web address] URL: [email or web address] Date: 12/31/2021 Prepared by: Narda Amber  Exercises Seated Ankle Alphabet - 3 x daily - 7 x weekly - 1 sets Towel Scrunches - 3 x daily - 7 x weekly - 3 reps Long Sitting Calf Stretch with Strap - 3 x daily - 7 x weekly - 3 sets - 3 reps - 30 seconds hold Ankle Inversion Eversion Towel Slide - 3 x daily - 7 x weekly - 2 sets - 10 reps Seated Ankle Pumps - 3 x daily - 7 x weekly - 2 sets - 10 reps   ASSESSMENT:  CLINICAL IMPRESSION: Patient is a 46 y.o. female who was seen today for physical therapy evaluation and treatment pain in her left ankle and left lower leg.  Vascular ultrasound showed evidence of ruptured Baker's cyst on the left which would explain the LE swelling/edema.  Pt presenting with evidence of posterior tibialis tendinitis. Pt presenting with mild weakness and limited ROM noted. Gout was ruled out per blood work per pt report. Pt was edu in HEP and we discussed upcoming  treatment options including DN and handout issued. Skilled PT needed to address pt's impairments with the below interventions.    OBJECTIVE IMPAIRMENTS decreased activity tolerance, decreased balance, decreased mobility, difficulty walking, decreased ROM, decreased strength, increased edema, impaired flexibility, and pain.   ACTIVITY LIMITATIONS community activity, occupation, yard work, and shopping.   PERSONAL FACTORS 1-2 comorbidities: DM, HTN  are also affecting patient's functional outcome.    REHAB POTENTIAL: Good  CLINICAL DECISION MAKING: Stable/uncomplicated  EVALUATION COMPLEXITY: Low   GOALS: Goals reviewed with patient? Yes  SHORT TERM GOALS:  STG Name Target Date Goal status  1 Independent with initial HEP Baseline:  01/21/2022 INITIAL  2 Pt improve 5 time sit to stand to </= 15 seconds with no UE support. Baseline:  01/21/2022 INITIAL        LONG TERM GOALS:   LTG Name Target  Date Goal status  1 Independent with final HEP Baseline: 02/11/2022 INITIAL  2 FOTO score improved to 67% for improved function Baseline: 38% on 12/31/2021 02/11/2022 INITIAL  3 Pt will  improve her left ankle dorsiflexion to >/= 15 degrees actively  for improved function Baseline: 02/11/2022 INITIAL  4 Report pain < 2/10 in left ankle when walking community surfaces and  distances for >/= 20 minutes.  Baseline: 02/11/2022 INITIAL  5 Pt will be able to climb a flight of stairs with step over step gait pattern with single hand rail.  Baseline: 02/11/2022 INITIAL               PLAN: PT FREQUENCY: 2x/week  PT DURATION: 6 weeks  PLANNED INTERVENTIONS: Therapeutic exercises, Therapeutic activity, Neuro Muscular re-education, Balance training, Gait training, Patient/Family education, Joint mobilization, Stair training, DME instructions, Dry Needling, Electrical stimulation, Cryotherapy, Moist heat, Taping, Ultrasound, Ionotophoresis 4mg /ml Dexamethasone, and Manual therapy. All included unless  contraindicated  PLAN FOR NEXT SESSION: Nustep, ankle ROM, ankle mobs, STM to ankle and calf, vasopneumatic   , PT, MPT 12/31/2021, 10:40 AM

## 2022-01-07 ENCOUNTER — Encounter: Payer: Self-pay | Admitting: Physical Therapy

## 2022-01-07 ENCOUNTER — Other Ambulatory Visit: Payer: Self-pay

## 2022-01-07 ENCOUNTER — Ambulatory Visit (INDEPENDENT_AMBULATORY_CARE_PROVIDER_SITE_OTHER): Payer: 59 | Admitting: Physical Therapy

## 2022-01-07 DIAGNOSIS — R600 Localized edema: Secondary | ICD-10-CM | POA: Diagnosis not present

## 2022-01-07 DIAGNOSIS — M25572 Pain in left ankle and joints of left foot: Secondary | ICD-10-CM | POA: Diagnosis not present

## 2022-01-07 DIAGNOSIS — R262 Difficulty in walking, not elsewhere classified: Secondary | ICD-10-CM | POA: Diagnosis not present

## 2022-01-07 DIAGNOSIS — M25672 Stiffness of left ankle, not elsewhere classified: Secondary | ICD-10-CM

## 2022-01-07 NOTE — Therapy (Signed)
OUTPATIENT PHYSICAL THERAPY TREATMENT NOTE   Patient Name: Gwynne Kemnitz MRN: [id number] DOB:Nov 13, 1975, 46 y.o., female Today's Date: 01/07/2022  PCP: Oneita Hurt, No REFERRING PROVIDER: Rodolph Bong, MD   PT End of Session - 01/07/22 0855     Visit Number 2    Number of Visits 13    Date for PT Re-Evaluation 02/11/22    PT Start Time 0845    PT Stop Time 0930    PT Time Calculation (min) 45 min    Activity Tolerance Patient tolerated treatment well    Behavior During Therapy [facility] for tasks assessed/performed             Past Medical History:  Diagnosis Date   Diabetes mellitus without complication (HCC)    Hypertension    Past Surgical History:  Procedure Laterality Date   CHOLECYSTECTOMY     Patient Active Problem List   Diagnosis Date Noted   Diabetes 1.5, 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   Morbid obesity (HCC) 03/08/2019   High blood pressure 11/22/2017    REFERRING DIAG: M25.572 (ICD-10-CM) - Acute left ankle pain     THERAPY DIAG:  Pain in left ankle and joints of left foot  Stiffness of left ankle, not elsewhere classified  Localized edema  Difficulty in walking, not elsewhere classified  PERTINENT HISTORY: Pt with left Bakers cyst rupture per vascular ultrasound PMH: DM, HTN  PRECAUTIONS: none  SUBJECTIVE: Pt arriving today with no assistive device amb with CAM walker.  PAIN:  Are you having pain? Yes NPRS scale: 6/10 Pain location: left posterior ankle  Pain orientation: Left  PAIN TYPE: aching and sharp Pain description: constant  Aggravating factors: walking, wt bearing   Relieving factors: sleeping, resting, prednisone       OBJECTIVE:    DIAGNOSTIC FINDINGS: ruptured baker's cyst left knee,    PATIENT SURVEYS:   12/31/2021: FOTO 37%, predicted 68%   COGNITION:          Overall cognitive status: Within functional limits for tasks assessed                        SENSATION:          Light touch: Appears intact      POSTURE:  Rounded shoulder, forward head   PALPATION: TTP; posterior ankle, medial ankle    EDEMA:  Rt ankle circumference  29 centimeters Left ankle circumference 31 centimeters   LE AROM/PROM:   AROM supine Right 12/31/2021 Left 12/31/2021  Knee flexion 126 128  Knee extension -2 hyperextension -2 hyperextension  Ankle dorsiflexion 8 4  Ankle plantarflexion 20 10  Ankle inversion 25 12  Ankle eversion 30 15   (Blank rows = not tested)   PROM supine Right 12/31/2021 Left 12/31/2021  Knee flexion      Knee extension      Ankle dorsiflexion 12 6  Ankle plantarflexion 24 12  Ankle inversion 28 18  Ankle eversion 34 20        LE MMT:   MMT Right 12/31/2021 Left 12/31/2021  Hip flexion 5/5 5/5  Hip extension      Hip abduction 5/5 5/5  Hip adduction 5/5 5/5  Knee flexion 5/5 5/5  Knee extension 5/5 5/5  Ankle dorsiflexion 5/5 3/5  Ankle plantarflexion 5/5 3/5  Ankle inversion 5/5 3/5  Ankle eversion 5/5 3/5   (Blank rows = not tested)       FUNCTIONAL TESTS:  5 times  sit to stand: 22 seconds with bilateral UE support   GAIT: Distance walked: 50 feet level surfaces Assistive device utilized: Crutches single Level of assistance: Modified independence Comments: CAM boot left foot   TODAY'S TREATMENT:   01/07/2022 Therapeutic Exercise:           Aerobic: Supine: Prone:           Seated: df stretches x 3 holding 30 seconds                     Towel scrunches x 3                     ABC's with ankle x 1                     Towel inversion/eversion x 1 minutes                     Ankle pumps x 10            BAPS board: L1 x1 minute each direction           [place] pick up 2x10           4-way ankle exercises 2x10 with Level 1 Theraband              Standing: Neuromuscular Re-education: Manual Therapy:  Therapeutic Activity: Self Care: Trigger Point Dry Needling:  Modalities: vasopneumatic 10 minutes 34 degrees left ankle   12/31/2021   Therapeutic Exercise:           Aerobic: Supine: Prone:           Seated: df stretches x 1 holding 30 seconds                     Towel scrunches x 2                      ABC's with ankle x 1                     Towel inversion/eversion                     Ankle pumps x 10                            Standing: Neuromuscular Re-education: Manual Therapy: Therapeutic Activity: Self Care: Trigger Point Dry Needling:  Modalities:        PATIENT EDUCATION:  Education details: PT POC, HEP, issued DN handout Person educated: Patient Education method: Explanation, Demonstration, Tactile cues, Verbal cues, and Handouts Education comprehension: verbalized understanding and returned demonstration     HOME EXERCISE PROGRAM: Access Code: [email or web address] URL: [email or web address] Date: 12/31/2021 Prepared by: Narda Amber   Exercises Seated Ankle Alphabet - 3 x daily - 7 x weekly - 1 sets Towel Scrunches - 3 x daily - 7 x weekly - 3 reps Long Sitting Calf Stretch with Strap - 3 x daily - 7 x weekly - 3 sets - 3 reps - 30 seconds hold Ankle Inversion Eversion Towel Slide - 3 x daily - 7 x weekly - 2 sets - 10 reps Seated Ankle Pumps - 3 x daily - 7 x weekly - 2 sets - 10 reps     ASSESSMENT:   CLINICAL IMPRESSION: 01/07/2022 Pt arriving today with no crutches and amb in CAM walker  on left ankle. Pt reporting pain of 6/10 in left ankle more on medial side. Pt reporting compliance in her HEP with increased soreness. Pt tolerating all exercises well with difficulty noted with eversion motions. Continue skilled PT to maximize pt's function.    12/31/2021 Patient is a 46 y.o. female who was seen today for physical therapy evaluation and treatment pain in her left ankle and left lower leg.  Vascular ultrasound showed evidence of ruptured Baker's cyst on the left which would explain the LE swelling/edema.  Pt presenting with evidence of posterior tibialis tendinitis. Pt  presenting with mild weakness and limited ROM noted. Gout was ruled out per blood work per pt report. Pt was edu in HEP and we discussed upcoming treatment options including DN and handout issued. Skilled PT needed to address pt's impairments with the below interventions.      OBJECTIVE IMPAIRMENTS decreased activity tolerance, decreased balance, decreased mobility, difficulty walking, decreased ROM, decreased strength, increased edema, impaired flexibility, and pain.    ACTIVITY LIMITATIONS community activity, occupation, yard work, and shopping.    PERSONAL FACTORS 1-2 comorbidities: DM, HTN  are also affecting patient's functional outcome.      REHAB POTENTIAL: Good   CLINICAL DECISION MAKING: Stable/uncomplicated   EVALUATION COMPLEXITY: Low     GOALS: Goals reviewed with patient? Yes   SHORT TERM GOALS:   STG Name Target Date Goal status  1 Independent with initial HEP Baseline:  01/21/2022 Ongoing  01/07/2022  2 Pt improve 5 time sit to stand to </= 15 seconds with no UE support. Baseline:  01/21/2022 Ongoing 01/07/2022             LONG TERM GOALS:    LTG Name Target Date Goal status  1 Independent with final HEP Baseline: 02/11/2022 INITIAL  2 FOTO score improved to 67% for improved function Baseline: 38% on 12/31/2021 02/11/2022 INITIAL  3 Pt will  improve her left ankle dorsiflexion to >/= 15 degrees actively  for improved function Baseline: 02/11/2022 INITIAL  4 Report pain < 2/10 in left ankle when walking community surfaces and  distances for >/= 20 minutes.  Baseline: 02/11/2022 INITIAL  5 Pt will be able to climb a flight of stairs with step over step gait pattern with single hand rail.  Baseline: 02/11/2022 INITIAL                          PLAN: PT FREQUENCY: 2x/week   PT DURATION: 6 weeks   PLANNED INTERVENTIONS: Therapeutic exercises, Therapeutic activity, Neuro Muscular re-education, Balance training, Gait training, Patient/Family education, Joint  mobilization, Stair training, DME instructions, Dry Needling, Electrical stimulation, Cryotherapy, Moist heat, Taping, Ultrasound, Ionotophoresis 4mg /ml Dexamethasone, and Manual therapy. All included unless contraindicated   PLAN FOR NEXT SESSION: ankle ROM, gentle strengthening, ankle mobs, STM to ankle and calf, vasopneumatic        , PT, MPT 01/07/2022, 9:12 AM

## 2022-01-09 NOTE — Progress Notes (Signed)
? ?I, [name], LAT, ATC, am serving as scribe for Dr. [name]. ? ?Ashley Weaver is a 46 y.o. female who presents to [facility] today for f/u of acute-onset L ankle pain w/ no MOI.  She was last seen by Dr. [name] on 12/23/21 and was advised to con't wearing a Cam walker boot.  She was referred to PT of which she's completed 3 sessions.  Today, pt reports that her L ankle is feeling better than previously, rating her improvement at 50%.  She has been consistently wearing her boot while up and on her foot but does not sleep w/ it.  She has been doing her HEP per PT. ? ?She has significant pain especially if she tries taking the boot off and walking around.  She notes her foot will become swollen pretty quickly with exercises out of the boot with physical therapy. ? ?Diagnostic testing: L ankle XR- 12/13/21 ? ?Pertinent review of systems: No fevers or chills ? ?Relevant historical information: Diabetes and hypertension. ? ? ?Exam:  ?BP 126/84 (BP Location: Right Arm, Patient Position: Sitting, Cuff Size: Large)   Pulse 71   Ht 5\' 5"  (1.651 m)   Wt 244 lb (110.7 kg)   SpO2 98%   BMI 40.60 kg/m?  ?General: Well Developed, well nourished, and in no acute distress.  ? ?MSK: Left foot and ankle some swelling present.  Tender palpation along medial ankle. ?Normal foot and ankle motion. ? ? ? ?Lab and Radiology Results ?EXAM: ?LEFT ANKLE COMPLETE - 3+ VIEW ?  ?COMPARISON:  February of 2008. ?  ?FINDINGS: ?Substantial soft tissue swelling. This is seen above the medial and ?lateral ankle and generalized throughout the visible lower ?extremity. Calcifications are noted in the soft tissues likely ?related to venous calcifications. ?  ?No signs of ankle effusion.  Ankle mortise is intact. ?  ?IMPRESSION: ?1. Substantial soft tissue swelling about the ankle and generalized ?lower extremity. No underlying osseous abnormality. Correlate with ?any signs of venous stasis or infection. ?2.  Vascular calcifications. ?  ?  ?Electronically Signed ?  By: 01-03-1993 M.D. ?  On: 12/13/2021 12:17 ?I, 02/10/2022, personally (independently) visualized and performed the interpretation of the images attached in this note. ? ? ? ? ?Assessment and Plan: ?46 y.o. female with Left foot and ankle pain medially.  ?The source of the pain is not clear at this time.  ?I think she did have a ruptured bakers cyst and I think she does probably have post tib tendonitis.  ? ?However she should be much better now than she is currently if this were the only the source of her pain.  She has more pain that I can understand.  She has not improved sufficiently despite boot and PT.  Plan for MRI of her ankle to further understand the source of her pain and to dictate future treatment options.  Recheck after MRI. ? ? ?PDMP not reviewed this encounter. ?Orders Placed This Encounter  ?Procedures  ? MR ANKLE LEFT WO CONTRAST  ?  Standing Status:   Future  ?  Standing Expiration Date:   01/14/2023  ?  Order Specific Question:   What is the patient's sedation requirement?  ?  Answer:   No Sedation  ?  Order Specific Question:   Does the patient have a pacemaker or implanted devices?  ?  Answer:   No  ?  Order Specific Question:   Preferred imaging location?  ?  Answer:   [facility] (table limit-350lbs)  ? ?No orders of the defined types were placed in this encounter. ? ? ? ?Discussed warning signs or symptoms. Please see discharge instructions. Patient expresses understanding. ? ? ?The above documentation has been reviewed and is accurate and complete [name], M.D. ? ? ?

## 2022-01-10 ENCOUNTER — Other Ambulatory Visit: Payer: Self-pay

## 2022-01-10 ENCOUNTER — Ambulatory Visit (INDEPENDENT_AMBULATORY_CARE_PROVIDER_SITE_OTHER): Payer: 59 | Admitting: Rehabilitative and Restorative Service Providers"

## 2022-01-10 ENCOUNTER — Encounter: Payer: Self-pay | Admitting: Rehabilitative and Restorative Service Providers"

## 2022-01-10 DIAGNOSIS — R262 Difficulty in walking, not elsewhere classified: Secondary | ICD-10-CM

## 2022-01-10 DIAGNOSIS — R600 Localized edema: Secondary | ICD-10-CM | POA: Diagnosis not present

## 2022-01-10 DIAGNOSIS — M25572 Pain in left ankle and joints of left foot: Secondary | ICD-10-CM

## 2022-01-10 DIAGNOSIS — M25672 Stiffness of left ankle, not elsewhere classified: Secondary | ICD-10-CM

## 2022-01-10 NOTE — Therapy (Signed)
OUTPATIENT PHYSICAL THERAPY TREATMENT NOTE   Patient Name: Coletta Memosamisha Coalson MRN: [id number] DOB:12/22/1975, 46 y.o., female Today's Date: 01/10/2022  PCP: Pcp, No REFERRING PROVIDER: No ref. provider found   PT End of Session - 01/10/22 1051     Visit Number 3    Number of Visits 13    Date for PT Re-Evaluation 02/11/22    PT Start Time 1015    PT Stop Time 1105    PT Time Calculation (min) 50 min    Activity Tolerance Patient tolerated treatment well    Behavior During Therapy WFL for tasks assessed/performed              Past Medical History:  Diagnosis Date   Diabetes mellitus without complication (HCC)    Hypertension    Past Surgical History:  Procedure Laterality Date   CHOLECYSTECTOMY     Patient Active Problem List   Diagnosis Date Noted   Diabetes 1.5, 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   Morbid obesity (HCC) 03/08/2019   High blood pressure 11/22/2017    REFERRING DIAG: M25.572 (ICD-10-CM) - Acute left ankle pain     THERAPY DIAG:  Stiffness of left ankle, not elsewhere classified  Pain in left ankle and joints of left foot  Localized edema  Difficulty in walking, not elsewhere classified  PERTINENT HISTORY: Pt with left Bakers cyst rupture per vascular ultrasound PMH: DM, HTN  PRECAUTIONS: none  SUBJECTIVE: Orra reports good HEP compliance.  She notes swelling increases throughout the day.  PAIN:  Are you having pain? Yes NPRS scale: 5/10 Pain location: left medial, posterior ankle  Pain orientation: Left  PAIN TYPE: aching and sharp Pain description: constant  Aggravating factors: walking, wt bearing, can affect sleep Relieving factors: sleeping, resting, prednisone    OBJECTIVE:    DIAGNOSTIC FINDINGS: ruptured baker's cyst left knee,    PATIENT SURVEYS:   12/31/2021: FOTO 37%, predicted 68%   COGNITION:          Overall cognitive status: Within functional limits for tasks assessed                        SENSATION:           Light touch: Appears intact     POSTURE:  Rounded shoulder, forward head   PALPATION: TTP; posterior ankle, medial ankle    EDEMA:  Rt ankle circumference  29 centimeters Left ankle circumference 31 centimeters   LE AROM/PROM:   AROM supine Right 12/31/2021 Left 12/31/2021  Knee flexion 126 128  Knee extension -2 hyperextension -2 hyperextension  Ankle dorsiflexion 8 4  Ankle plantarflexion 20 10  Ankle inversion 25 12  Ankle eversion 30 15   (Blank rows = not tested)   PROM supine Right 12/31/2021 Left 12/31/2021  Knee flexion      Knee extension      Ankle dorsiflexion 12 6  Ankle plantarflexion 24 12  Ankle inversion 28 18  Ankle eversion 34 20        LE MMT:   MMT Right 12/31/2021 Left 12/31/2021  Hip flexion 5/5 5/5  Hip extension      Hip abduction 5/5 5/5  Hip adduction 5/5 5/5  Knee flexion 5/5 5/5  Knee extension 5/5 5/5  Ankle dorsiflexion 5/5 3/5  Ankle plantarflexion 5/5 3/5  Ankle inversion 5/5 3/5  Ankle eversion 5/5 3/5   (Blank rows = not tested)       FUNCTIONAL TESTS:  5 times sit to stand: 22 seconds with bilateral UE support   GAIT: Distance walked: 50 feet level surfaces Assistive device utilized: Crutches single Level of assistance: Modified independence Comments: CAM boot left foot    TODAY'S TREATMENT:   01/10/2022 Seated heel cords stretch with belt 4X 20 seconds (slight bend in knee to avoid hyperextension) Towel inversion/eversion with 5# weight 5X each Ankle inversion/eversion yellow 2 sets of 10 with slow eccentrics Seated ankle dorsiflexion stretch (slight toe in while seated, slide ankle/heel back to stretch soleus) 10X 20 seconds  BAPS board 20X CW and CCW  Discussed standing heel to toe raise with proper footwear  Vasopneumatic L ankle 10 minutes 34* Medium pressure   01/07/2022 Therapeutic Exercise:           Aerobic: Supine: Prone:           Seated: df stretches x 3 holding 30 seconds                      Towel scrunches x 3                     ABC's with ankle x 1                     Towel inversion/eversion x 1 minutes                     Ankle pumps x 10            BAPS board: L1 x1 minute each direction           Marble pick up 2x10           4-way ankle exercises 2x10 with Level 1 Theraband              Standing: Neuromuscular Re-education: Manual Therapy:  Therapeutic Activity: Self Care: Trigger Point Dry Needling:  Modalities: vasopneumatic 10 minutes 34 degrees left ankle    12/31/2021  Therapeutic Exercise:           Aerobic: Supine: Prone:           Seated: df stretches x 1 holding 30 seconds                     Towel scrunches x 2                      ABC's with ankle x 1                     Towel inversion/eversion                     Ankle pumps x 10                            Standing: Neuromuscular Re-education: Manual Therapy: Therapeutic Activity: Self Care: Trigger Point Dry Needling:  Modalities:        PATIENT EDUCATION:  Education details: PT POC, HEP Person educated: Patient Education method: Explanation, Demonstration, Tactile cues, Verbal cues, and Handouts Education comprehension: verbalized understanding and returned demonstration     HOME EXERCISE PROGRAM:  Access Code: [email or web address] URL: [email or web address] Date: 01/10/2022 Prepared by: Pauletta Browns  Exercises Seated Ankle Alphabet - 3 x daily - 7 x weekly - 1 sets Towel Scrunches - 3 x daily - 7 x weekly - 3 reps  Long Sitting Calf Stretch with Strap - 3 x daily - 7 x weekly - 3 sets - 3 reps - 30 seconds hold Ankle Inversion Eversion Towel Slide - 3 x daily - 7 x weekly - 2 sets - 10 reps Seated Ankle Pumps - 3 x daily - 7 x weekly - 2 sets - 10 reps Ankle Inversion with Resistance - 1 x daily - 7 x weekly - 2-3 sets - 10 reps Ankle Eversion with Resistance - 1 x daily - 7 x weekly - 2-3 sets - 10 reps Heel Toe Raises with Counter Support - 3-5 x daily - 7 x  weekly - 1 sets - 3-10 reps - 3 seconds hold   ASSESSMENT:   CLINICAL IMPRESSION:  01/10/2022 Jernee reports good HEP compliance.  We discussed progressing heel toe raises (with UE support) PRN in a shoe, keeping an eye on swelling and pain to complement her current program.  Strength and dorsiflexion AROM emphasis currently to decrease edema, pain, improve strength and WB function.   01/07/2022 Pt arriving today with no crutches and amb in CAM walker on left ankle. Pt reporting pain of 6/10 in left ankle more on medial side. Pt reporting compliance in her HEP with increased soreness. Pt tolerating all exercises well with difficulty noted with eversion motions. Continue skilled PT to maximize pt's function.    12/31/2021 Patient is a 46 y.o. female who was seen today for physical therapy evaluation and treatment pain in her left ankle and left lower leg.  Vascular ultrasound showed evidence of ruptured Baker's cyst on the left which would explain the LE swelling/edema.  Pt presenting with evidence of posterior tibialis tendinitis. Pt presenting with mild weakness and limited ROM noted. Gout was ruled out per blood work per pt report. Pt was edu in HEP and we discussed upcoming treatment options including DN and handout issued. Skilled PT needed to address pt's impairments with the below interventions.      OBJECTIVE IMPAIRMENTS decreased activity tolerance, decreased balance, decreased mobility, difficulty walking, decreased ROM, decreased strength, increased edema, impaired flexibility, and pain.    ACTIVITY LIMITATIONS community activity, occupation, yard work, and shopping.    PERSONAL FACTORS 1-2 comorbidities: DM, HTN  are also affecting patient's functional outcome.      REHAB POTENTIAL: Good   CLINICAL DECISION MAKING: Stable/uncomplicated   EVALUATION COMPLEXITY: Low     GOALS: Goals reviewed with patient? Yes   SHORT TERM GOALS:   STG Name Target Date Goal status  1  Independent with initial HEP Baseline:  01/21/2022 Ongoing  01/07/2022  2 Pt improve 5 time sit to stand to </= 15 seconds with no UE support. Baseline:  01/21/2022 Ongoing 01/07/2022             LONG TERM GOALS:    LTG Name Target Date Goal status  1 Independent with final HEP Baseline: 02/11/2022 INITIAL  2 FOTO score improved to 67% for improved function Baseline: 38% on 12/31/2021 02/11/2022 INITIAL  3 Pt will  improve her left ankle dorsiflexion to >/= 15 degrees actively  for improved function Baseline: 02/11/2022 INITIAL  4 Report pain < 2/10 in left ankle when walking community surfaces and  distances for >/= 20 minutes.  Baseline: 02/11/2022 INITIAL  5 Pt will be able to climb a flight of stairs with step over step gait pattern with single hand rail.  Baseline: 02/11/2022 INITIAL  PLAN: PT FREQUENCY: 2x/week   PT DURATION: 6 weeks   PLANNED INTERVENTIONS: Therapeutic exercises, Therapeutic activity, Neuro Muscular re-education, Balance training, Gait training, Patient/Family education, Joint mobilization, Stair training, DME instructions, Dry Needling, Electrical stimulation, Cryotherapy, Moist heat, Taping, Ultrasound, Ionotophoresis 4mg /ml Dexamethasone, and Manual therapy. All included unless contraindicated   PLAN FOR NEXT SESSION: Ankle ROM, gentle strengthening, ankle mobs, STM to ankle and calf, vasopneumatic        Cherlyn Cushing, PT, MPT 01/10/2022, 10:52 AM

## 2022-01-13 ENCOUNTER — Ambulatory Visit (INDEPENDENT_AMBULATORY_CARE_PROVIDER_SITE_OTHER): Payer: 59 | Admitting: Physical Therapy

## 2022-01-13 ENCOUNTER — Encounter: Payer: Self-pay | Admitting: Family Medicine

## 2022-01-13 ENCOUNTER — Encounter: Payer: Self-pay | Admitting: Physical Therapy

## 2022-01-13 ENCOUNTER — Other Ambulatory Visit: Payer: Self-pay

## 2022-01-13 ENCOUNTER — Ambulatory Visit (INDEPENDENT_AMBULATORY_CARE_PROVIDER_SITE_OTHER): Payer: 59 | Admitting: Family Medicine

## 2022-01-13 VITALS — BP 126/84 | HR 71 | Ht 65.0 in | Wt 244.0 lb

## 2022-01-13 DIAGNOSIS — M25572 Pain in left ankle and joints of left foot: Secondary | ICD-10-CM

## 2022-01-13 DIAGNOSIS — M25672 Stiffness of left ankle, not elsewhere classified: Secondary | ICD-10-CM

## 2022-01-13 DIAGNOSIS — R262 Difficulty in walking, not elsewhere classified: Secondary | ICD-10-CM | POA: Diagnosis not present

## 2022-01-13 DIAGNOSIS — R600 Localized edema: Secondary | ICD-10-CM

## 2022-01-13 NOTE — Patient Instructions (Addendum)
Good to see you today ? ?Con't PT and your home exercises. ? ?I've ordered an ankle MRI.  That facility will call you to schedule but please let us know if you haven't heard from them in one week regarding scheduling. ? ?Follow-up: after MRI ?

## 2022-01-13 NOTE — Therapy (Addendum)
OUTPATIENT PHYSICAL THERAPY TREATMENT NOTE Progress Note   Patient Name: Ashley Weaver MRN: [id number] DOB:11/16/1975, 46 y.o., female Today's Date: 01/13/2022  PCP: Oneita HurtPcp, No REFERRING PROVIDER: Rodolph Bongorey, Evan S, MD  Progress Note Reporting Period 12/31/2021 to 01/13/2022   See note below for Objective Data and Assessment of Progress/Goals.       PT End of Session - 01/13/22 0854     Authorization Type PN sent to Dr. Denyse Amassorey on 01/13/2022 at 4th visit               Past Medical History:  Diagnosis Date   Diabetes mellitus without complication (HCC)    Hypertension    Past Surgical History:  Procedure Laterality Date   CHOLECYSTECTOMY     Patient Active Problem List   Diagnosis Date Noted   Diabetes 1.5, 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   Morbid obesity (HCC) 03/08/2019   High blood pressure 11/22/2017    REFERRING DIAG: M25.572 (ICD-10-CM) - Acute left ankle pain     THERAPY DIAG:  Stiffness of left ankle, not elsewhere classified  Pain in left ankle and joints of left foot  Difficulty in walking, not elsewhere classified  Localized edema  PERTINENT HISTORY: Pt with left Bakers cyst rupture per vascular ultrasound PMH: DM, HTN  PRECAUTIONS: none  SUBJECTIVE:  01/13/2022: Pt arriving reporting compliance in her HEP, Pt still reporting 5/10 pain in her left ankle and swelling throughout the day.   PAIN:  Are you having pain? Yes NPRS scale: 5/10 Pain location: left medial, posterior ankle  Pain orientation: Left  PAIN TYPE: aching and sharp Pain description: constant  Aggravating factors: walking, wt bearing, can affect sleep Relieving factors: sleeping, resting, prednisone    OBJECTIVE:    DIAGNOSTIC FINDINGS: ruptured baker's cyst left knee,    PATIENT SURVEYS:   12/31/2021: FOTO 37%, predicted 68%   COGNITION:          Overall cognitive status: Within functional limits for tasks assessed                        SENSATION:          Light  touch: Appears intact     POSTURE:  Rounded shoulder, forward head   PALPATION: TTP; posterior ankle, medial ankle    EDEMA:  Rt ankle circumference  29 centimeters Left ankle circumference 31 centimeters   LE AROM/PROM:   AROM supine Right 12/31/2021 Left 12/31/2021 Left 01/13/2022  Knee flexion 126 128 128  Knee extension -2 hyperextension -2 hyperextension -2 hyperextension  Ankle dorsiflexion 8 4 5   Ankle plantarflexion 20 10 22   Ankle inversion 25 12 12   Ankle eversion 30 15 15    (Blank rows = not tested)   PROM supine Right 12/31/2021 Left 12/31/2021 Left 01/13/2022  Knee flexion       Knee extension       Ankle dorsiflexion 12 6 10   Ankle plantarflexion 24 12 25   Ankle inversion 28 18 25   Ankle eversion 34 20 34        LE MMT:   MMT Right 12/31/2021 Left 12/31/2021  Hip flexion 5/5 5/5  Hip extension      Hip abduction 5/5 5/5  Hip adduction 5/5 5/5  Knee flexion 5/5 5/5  Knee extension 5/5 5/5  Ankle dorsiflexion 5/5 3/5  Ankle plantarflexion 5/5 3/5  Ankle inversion 5/5 3/5  Ankle eversion 5/5 3/5   (Blank rows = not tested)  FUNCTIONAL TESTS:  5 times sit to stand: 22 seconds with bilateral UE support 01/13/2022: 5 time sit to stand: 18 seconds with UE support   GAIT:   01/13/2022:  Distance walked: 200 feet level surfaces Assistive device utilized:no device Level of assistance:  independence Comments: CAM boot left foot  12/31/2021:  Distance walked: 50 feet level surfaces Assistive device utilized: Crutches single Level of assistance: Modified independence Comments: CAM boot left foot    TODAY'S TREATMENT:   01/13/2022:  Seated achilles stretch Left with strap holding 2 minutes Seated rocker board: 2 minutes each direction BAPS: L2 2 minutes each direction Seated: left ankle 4 way ankle exercises with Level 1 Theraband x 15 each PROM: left ankle all directions Vasopneumatic: 10 minutes, left ankle, 34 degrees, medium  compression   01/10/2022 Seated heel cords stretch with belt 4X 20 seconds (slight bend in knee to avoid hyperextension) Towel inversion/eversion with 5# weight 5X each Ankle inversion/eversion yellow 2 sets of 10 with slow eccentrics Seated ankle dorsiflexion stretch (slight toe in while seated, slide ankle/heel back to stretch soleus) 10X 20 seconds  BAPS board 20X CW and CCW  Discussed standing heel to toe raise with proper footwear  Vasopneumatic L ankle 10 minutes 34* Medium pressure   01/07/2022 Therapeutic Exercise:           Aerobic: Supine: Prone:           Seated: df stretches x 3 holding 30 seconds                     Towel scrunches x 3                     ABC's with ankle x 1                     Towel inversion/eversion x 1 minutes                     Ankle pumps x 10            BAPS board: L1 x1 minute each direction           Marble pick up 2x10           4-way ankle exercises 2x10 with Level 1 Theraband              Standing: Neuromuscular Re-education: Manual Therapy:  Therapeutic Activity: Self Care: Trigger Point Dry Needling:  Modalities: vasopneumatic 10 minutes 34 degrees left ankle    12/31/2021  Therapeutic Exercise:           Aerobic: Supine: Prone:           Seated: df stretches x 1 holding 30 seconds                     Towel scrunches x 2                      ABC's with ankle x 1                     Towel inversion/eversion                     Ankle pumps x 10                            Standing: Neuromuscular Re-education:  Manual Therapy: Therapeutic Activity: Self Care: Trigger Point Dry Needling:  Modalities:        PATIENT EDUCATION:  Education details: PT POC, HEP Person educated: Patient Education method: Explanation, Demonstration, Tactile cues, Verbal cues, and Handouts Education comprehension: verbalized understanding and returned demonstration     HOME EXERCISE PROGRAM:  Access Code: [email or web address] URL:  [email or web address] Date: 01/10/2022 Prepared by: Pauletta Browns  Exercises Seated Ankle Alphabet - 3 x daily - 7 x weekly - 1 sets Towel Scrunches - 3 x daily - 7 x weekly - 3 reps Long Sitting Calf Stretch with Strap - 3 x daily - 7 x weekly - 3 sets - 3 reps - 30 seconds hold Ankle Inversion Eversion Towel Slide - 3 x daily - 7 x weekly - 2 sets - 10 reps Seated Ankle Pumps - 3 x daily - 7 x weekly - 2 sets - 10 reps Ankle Inversion with Resistance - 1 x daily - 7 x weekly - 2-3 sets - 10 reps Ankle Eversion with Resistance - 1 x daily - 7 x weekly - 2-3 sets - 10 reps Heel Toe Raises with Counter Support - 3-5 x daily - 7 x weekly - 1 sets - 3-10 reps - 3 seconds hold   ASSESSMENT:   CLINICAL IMPRESSION:  01/13/2022:  Pt tolerating exercises well with improvements in active and passive ROM. Pt still reporting 5/10 pain. Pt still amb in a CAM boot on her left foot with no device needed. Continue skilled PT progressing toward LTG's set.   01/10/2022 Graelyn reports good HEP compliance.  We discussed progressing heel toe raises (with UE support) PRN in a shoe, keeping an eye on swelling and pain to complement her current program.  Strength and dorsiflexion AROM emphasis currently to decrease edema, pain, improve strength and WB function.   01/07/2022 Pt arriving today with no crutches and amb in CAM walker on left ankle. Pt reporting pain of 6/10 in left ankle more on medial side. Pt reporting compliance in her HEP with increased soreness. Pt tolerating all exercises well with difficulty noted with eversion motions. Continue skilled PT to maximize pt's function.    12/31/2021 Patient is a 46 y.o. female who was seen today for physical therapy evaluation and treatment pain in her left ankle and left lower leg.  Vascular ultrasound showed evidence of ruptured Baker's cyst on the left which would explain the LE swelling/edema.  Pt presenting with evidence of posterior tibialis  tendinitis. Pt presenting with mild weakness and limited ROM noted. Gout was ruled out per blood work per pt report. Pt was edu in HEP and we discussed upcoming treatment options including DN and handout issued. Skilled PT needed to address pt's impairments with the below interventions.      OBJECTIVE IMPAIRMENTS decreased activity tolerance, decreased balance, decreased mobility, difficulty walking, decreased ROM, decreased strength, increased edema, impaired flexibility, and pain.    ACTIVITY LIMITATIONS community activity, occupation, yard work, and shopping.    PERSONAL FACTORS 1-2 comorbidities: DM, HTN  are also affecting patient's functional outcome.      REHAB POTENTIAL: Good   CLINICAL DECISION MAKING: Stable/uncomplicated   EVALUATION COMPLEXITY: Low     GOALS: Goals reviewed with patient? Yes   SHORT TERM GOALS:   STG Name Target Date Goal status  1 Independent with initial HEP Baseline:  01/21/2022 Ongoing  01/07/2022  2 Pt improve 5 time sit to stand to </= 15 seconds with no UE support. Baseline:  01/21/2022 Ongoing 01/07/2022             LONG TERM GOALS:    LTG Name Target Date Goal status  1 Independent with final HEP Baseline: 02/11/2022 Ongoing 01/13/2022  2 FOTO score improved to 67% for improved function Baseline: 38% on 12/31/2021 02/11/2022 Ongoing 01/13/2022  3 Pt will  improve her left ankle dorsiflexion to >/= 15 degrees actively  for improved function Baseline: 02/11/2022 Ongoing 01/13/2022  4 Report pain < 2/10 in left ankle when walking community surfaces and  distances for >/= 20 minutes.  Baseline: 02/11/2022 Ongoing 01/13/2022  5 Pt will be able to climb a flight of stairs with step over step gait pattern with single hand rail.  Baseline: 02/11/2022 Ongoing 01/13/2022                          PLAN: PT FREQUENCY: 2x/week   PT DURATION: 6 weeks   PLANNED INTERVENTIONS: Therapeutic exercises, Therapeutic activity, Neuro Muscular re-education, Balance  training, Gait training, Patient/Family education, Joint mobilization, Stair training, DME instructions, Dry Needling, Electrical stimulation, Cryotherapy, Moist heat, Taping, Ultrasound, Ionotophoresis 4mg /ml Dexamethasone, and Manual therapy. All included unless contraindicated   PLAN FOR NEXT SESSION: Ankle ROM, gentle strengthening, ankle mobs, STM to ankle and calf, vasopneumatic        , PT, MPT 01/13/2022, 8:55 AM

## 2022-01-16 ENCOUNTER — Other Ambulatory Visit: Payer: Self-pay

## 2022-01-16 ENCOUNTER — Ambulatory Visit (INDEPENDENT_AMBULATORY_CARE_PROVIDER_SITE_OTHER): Payer: 59 | Admitting: Rehabilitative and Restorative Service Providers"

## 2022-01-16 ENCOUNTER — Encounter: Payer: Self-pay | Admitting: Rehabilitative and Restorative Service Providers"

## 2022-01-16 DIAGNOSIS — M25572 Pain in left ankle and joints of left foot: Secondary | ICD-10-CM

## 2022-01-16 DIAGNOSIS — R262 Difficulty in walking, not elsewhere classified: Secondary | ICD-10-CM

## 2022-01-16 DIAGNOSIS — R600 Localized edema: Secondary | ICD-10-CM | POA: Diagnosis not present

## 2022-01-16 DIAGNOSIS — M25672 Stiffness of left ankle, not elsewhere classified: Secondary | ICD-10-CM

## 2022-01-16 NOTE — Therapy (Signed)
OUTPATIENT PHYSICAL THERAPY TREATMENT NOTE Progress Note   Patient Name: Ashley Weaver MRN: [id number] DOB:[birth year], 46 y.o., female Today's Date: 01/16/2022  PCP: Merryl Hacker, No REFERRING PROVIDER: Gregor Hams, MD  Progress Note Reporting Period 12/31/2021 to 01/16/2022   See note below for Objective Data and Assessment of Progress/Goals.       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 Georgina Snell ordered an MRI as he was expecting her to be further along.  She reports good HEP compliance.  PAIN:  Are you having pain? Yes NPRS scale: 3-4/10 Pain location: left medial, posterior ankle  Pain orientation: Left  PAIN TYPE: aching and sharp Pain description: constant  Aggravating factors: walking, wt bearing, sleep is better but can still be interrupted Relieving factors: sleeping, resting, prednisone    OBJECTIVE:    DIAGNOSTIC FINDINGS: ruptured  baker's cyst left knee,    PATIENT SURVEYS:   12/31/2021: FOTO 37%, predicted 68%   COGNITION:          Overall cognitive status: Within functional limits for tasks assessed                        SENSATION:          Light touch: Appears intact     POSTURE:  Rounded shoulder, forward head   PALPATION: TTP; posterior ankle, medial ankle    EDEMA:  Rt ankle circumference  29 centimeters Left ankle circumference 31 centimeters   LE AROM/PROM:   AROM supine Right 12/31/2021 Left 12/31/2021 Left 01/13/2022  Knee flexion 126 128 128  Knee extension -2 hyperextension -2 hyperextension -2 hyperextension  Ankle dorsiflexion 8 4 5   Ankle plantarflexion 20 10 22   Ankle inversion 25 12 12   Ankle eversion 30 15 15    (Blank rows = not tested)   PROM supine Right 12/31/2021 Left 12/31/2021 Left 01/13/2022  Knee flexion       Knee extension       Ankle dorsiflexion 12 6 10   Ankle plantarflexion 24 12 25   Ankle inversion 28 18 25   Ankle eversion 34 20 34        LE MMT:   MMT Right 12/31/2021 Left 12/31/2021  Hip flexion 5/5 5/5  Hip extension  Hip abduction 5/5 5/5  Hip adduction 5/5 5/5  Knee flexion 5/5 5/5  Knee extension 5/5 5/5  Ankle dorsiflexion 5/5 3/5  Ankle plantarflexion 5/5 3/5  Ankle inversion 5/5 3/5  Ankle eversion 5/5 3/5   (Blank rows = not tested)       FUNCTIONAL TESTS:  5 times sit to stand: 22 seconds with bilateral UE support 01/13/2022: 5 time sit to stand: 18 seconds with UE support    GAIT:   01/13/2022:  Distance walked: 200 feet level surfaces Assistive device utilized:no device Level of assistance:  independence Comments: CAM boot left foot  12/31/2021:  Distance walked: 50 feet level surfaces Assistive device utilized: Crutches single Level of assistance: Modified independence Comments: CAM boot left foot    TODAY'S TREATMENT:  01/16/2022: Seated heel cords stretch with belt 4X 20 seconds (slight bend in knee to avoid  hyperextension)  Towel inversion/eversion with 5# weight 5X each  Ankle inversion/eversion Red 15X each with slow eccentrics  Seated heel cords box 3X 1 minute  Ankle Isometrics Inversion/Eversion 10X 5 seconds each seated BAPS board 20X CW and CCW  Discussed standing heel to toe raise with proper footwear  Vasopneumatic L ankle 10 minutes 34* Medium pressure   01/13/2022:  Seated achilles stretch Left with strap holding 2 minutes Seated rocker board: 2 minutes each direction BAPS: L2 2 minutes each direction Seated: left ankle 4 way ankle exercises with Level 1 Theraband x 15 each PROM: left ankle all directions Vasopneumatic: 10 minutes, left ankle, 34 degrees, medium compression   01/10/2022 Seated heel cords stretch with belt 4X 20 seconds (slight bend in knee to avoid hyperextension) Towel inversion/eversion with 5# weight 5X each Ankle inversion/eversion yellow 2 sets of 10 with slow eccentrics Seated ankle dorsiflexion stretch (slight toe in while seated, slide ankle/heel back to stretch soleus) 10X 20 seconds  BAPS board 20X CW and CCW  Discussed standing heel to toe raise with proper footwear  Vasopneumatic L ankle 10 minutes 34* Medium pressure     PATIENT EDUCATION:  Education details: PT POC, HEP Person educated: Patient Education method: Explanation, Demonstration, Tactile cues, Verbal cues, and Handouts Education comprehension: verbalized understanding and returned demonstration     HOME EXERCISE PROGRAM:  Access Code: [email or web address] URL: [email or web address] Date: 01/10/2022 Prepared by: Vista Mink  Exercises Seated Ankle Alphabet - 3 x daily - 7 x weekly - 1 sets Towel Scrunches - 3 x daily - 7 x weekly - 3 reps Long Sitting Calf Stretch with Strap - 3 x daily - 7 x weekly - 3 sets - 3 reps - 30 seconds hold Ankle Inversion Eversion Towel Slide - 3 x daily - 7 x weekly - 2 sets - 10 reps Seated Ankle Pumps - 3 x daily - 7 x weekly - 2  sets - 10 reps Ankle Inversion with Resistance - 1 x daily - 7 x weekly - 2-3 sets - 10 reps Ankle Eversion with Resistance - 1 x daily - 7 x weekly - 2-3 sets - 10 reps Heel Toe Raises with Counter Support - 3-5 x daily - 7 x weekly - 1 sets - 3-10 reps - 3 seconds hold   ASSESSMENT:   CLINICAL IMPRESSION: 01/16/2022: Ashley Weaver is waiting on her MRI to be scheduled per Dr. Clovis Riley order.  I encouraged her to bring a shoe to PT to progress appropriate AROM (standing dorsiflexion stretching) and strength (heel to toe raises, tandem balance).  Continue her current POC to meet  short and long term goals while awaiting MRI results.   01/13/2022:  Pt tolerating exercises well with improvements in active and passive ROM. Pt still reporting 5/10 pain. Pt still amb in a CAM boot on her left foot with no device needed. Continue skilled PT progressing toward LTG's set.    01/10/2022 Ashley Weaver reports good HEP compliance.  We discussed progressing heel toe raises (with UE support) PRN in a shoe, keeping an eye on swelling and pain to complement her current program.  Strength and dorsiflexion AROM emphasis currently to decrease edema, pain, improve strength and WB function.   OBJECTIVE IMPAIRMENTS decreased activity tolerance, decreased balance, decreased mobility, difficulty walking, decreased ROM, decreased strength, increased edema, impaired flexibility, and pain.    ACTIVITY LIMITATIONS community activity, occupation, yard work, and shopping.    PERSONAL FACTORS 1-2 comorbidities: DM, HTN  are also affecting patient's functional outcome.      REHAB POTENTIAL: Good   CLINICAL DECISION MAKING: Stable/uncomplicated   EVALUATION COMPLEXITY: Low     GOALS: Goals reviewed with patient? Yes   SHORT TERM GOALS:   STG Name Target Date Goal status  1 Independent with initial HEP Baseline:  01/21/2022 Ongoing  01/07/2022  2 Pt improve 5 time sit to stand to </= 15 seconds with no UE support. Baseline:   01/21/2022 Ongoing 01/07/2022             LONG TERM GOALS:    LTG Name Target Date Goal status  1 Independent with final HEP Baseline: 02/11/2022 Ongoing 01/13/2022  2 FOTO score improved to 67% for improved function Baseline: 38% on 12/31/2021 02/11/2022 Ongoing 01/13/2022  3 Pt will  improve her left ankle dorsiflexion to >/= 15 degrees actively  for improved function Baseline: 02/11/2022 Ongoing 01/13/2022  4 Report pain < 2/10 in left ankle when walking community surfaces and  distances for >/= 20 minutes.  Baseline: 02/11/2022 Ongoing 01/13/2022  5 Pt will be able to climb a flight of stairs with step over step gait pattern with single hand rail.  Baseline: 02/11/2022 Ongoing 01/13/2022                          PLAN: PT FREQUENCY: 2x/week   PT DURATION: 6 weeks   PLANNED INTERVENTIONS: Therapeutic exercises, Therapeutic activity, Neuro Muscular re-education, Balance training, Gait training, Patient/Family education, Joint mobilization, Stair training, DME instructions, Dry Needling, Electrical stimulation, Cryotherapy, Moist heat, Taping, Ultrasound, Ionotophoresis 4mg /ml Dexamethasone, and Manual therapy. All included unless contraindicated   PLAN FOR NEXT SESSION: Ankle ROM, strengthening progressions in a shoe, balance PRN, vasopneumatic        Farley Ly, PT, MPT 01/16/2022, 3:00 PM

## 2022-01-19 ENCOUNTER — Other Ambulatory Visit: Payer: 59

## 2022-01-20 ENCOUNTER — Encounter: Payer: 59 | Admitting: Physical Therapy

## 2022-01-20 ENCOUNTER — Other Ambulatory Visit: Payer: Self-pay

## 2022-01-20 ENCOUNTER — Ambulatory Visit (INDEPENDENT_AMBULATORY_CARE_PROVIDER_SITE_OTHER): Payer: 59

## 2022-01-20 DIAGNOSIS — M25572 Pain in left ankle and joints of left foot: Secondary | ICD-10-CM

## 2022-01-21 NOTE — Progress Notes (Signed)
? ?I, [name], LAT, ATC, am serving as scribe for Dr. [name]. ? ?Ashley Weaver is a 46 y.o. female who presents to [facility] at [facility] today for f/u of L ankle pain due to a peroneal brevis tear and ankle arthritis and to review her L ankle MRI results.  She was last seen by Dr. [name] on 01/13/22 and noted 50% improvement in her symptoms.  She has been attending PT and has completed 5 visits.  She con't to wear her ankle boot.  Today, pt reports that her L ankle is feeling better, rating her improvement at 50-60%.  She has been doing her HEP and feels like she does the same thing at PT as she does at home so isn't sure about con't PT. ? ?Diagnostic testing: L ankle MRI- 01/20/22; L ankle XR- 12/13/21 ? ?Pertinent review of systems: No fevers or chills ? ?Relevant historical information: Hypertension and diabetes. ? ? ?Exam:  ?BP 140/90 (BP Location: Left Arm, Patient Position: Sitting, Cuff Size: Large)   Pulse 90   Ht 5\' 5"  (1.651 m)   Wt 251 lb 6.4 oz (114 kg)   SpO2 98%   BMI 41.84 kg/m?  ?General: Well Developed, well nourished, and in no acute distress.  ? ?MSK: Left ankle: Tender palpation at medial ankle.  Some pain is present with flexion of her toes. ? ? ? ?Lab and Radiology Results ?EXAM: ?MRI OF THE LEFT ANKLE WITHOUT CONTRAST ?  ?TECHNIQUE: ?Multiplanar, multisequence MR imaging of the ankle was performed. No ?intravenous contrast was administered. ?  ?COMPARISON:  Left ankle radiographs 12/13/2021 ?  ?FINDINGS: ?TENDONS ?  ?Peroneal: There is a "chevron configuration" of the peroneus brevis ?tendon at the distal aspect of the fibula (axial image 18), a ?short-segment longitudinal split tear that extends through both the ?anterior and posterior tendon surfaces. Just proximal to this there ?is a more mild partial-thickness longitudinal split tear extending ?through the posterior wall and possibly portions of the anterior ?wall (axial images 6 through 15). There is  additional abnormal ?linear intermediate T2 signal within the midsubstance of the more ?distal peroneus brevis tendon at the level of the superior aspect of ?the anterior process of the calcaneus (axial images 23 through 25) ?tendinosis and possible chronic midsubstance linear tear. The ?peroneus longus tendon is intact. ?  ?Posteromedial: Mild posterior tibial, mild-to-moderate flexor ?digitorum longus, and mild flexor hallucis longus tenosynovitis. ?  ?Anterior: The tibialis anterior, extensor hallucis longus and ?extensor digitorum longus tendons are intact. There is mild fluid ?within the subcutaneous fat just anterior to the extensor digitorum ?longus tendon at the level of the tibiotalar joint space (axial ?image 13). ?  ?Achilles: Intact. ?  ?Plantar Fascia: Intact. ?  ?LIGAMENTS ?  ?Lateral: The anterior and posterior talofibular, anterior and ?posterior tibiofibular, and calcaneofibular ligaments are intact. ?  ?Medial: The tibiotalar deep deltoid and tibial spring ligaments are ?intact. ?  ?CARTILAGE ?  ?Ankle Joint: Mild thinning of the far medial aspect of the talar ?dome cartilage with mild subchondral intermediate T2 signal (coronal ?image 18). Mild tibiotalar, posterior subtalar, and talonavicular ?joint effusions. ?  ?Subtalar Joints/Sinus Tarsi: Fat is preserved within sinus tarsi. ?  ?Bones: Mild-to-moderate tarsometatarsal and navicular-cuneiform ?cartilage thinning. ?  ?Other: The tarsal tunnel is unremarkable. The Lisfranc ligament ?complex is intact. ?  ?IMPRESSION: ?There are multiple areas of longitudinal split tears within the ?peroneus brevis tendon including at the level of the distal fibular ?metaphysis, just distal to  the fibula, and within the more distal ?tendon. ?  ?Mild-to-moderate flexor digitorum longus mild posterior tibial and ?mild flexor hallucis longus tenosynovitis. ?  ?Mild tibiotalar and mild-to-moderate midfoot cartilage degenerative ?changes. ?  ?  ?Electronically  Signed ?  By: Neita Garnet M.D. ?  On: 01/20/2022 14:10 ?  ?I, [name], personally (independently) visualized and performed the interpretation of the images attached in this note. ? ? ? ? ? ?Assessment and Plan: ?46 y.o. female with left medial ankle pain thought primarily due to tendinitis along the medial ankle tendons.  Especially the flexor digitorum tendon. ?Plan to treat with physical therapy.  Now we know the diagnosis more precisely physical therapy can directed for this problem more specifically.  Also we will add nitroglycerin patch protocol.  We will transition out of the cam walker boot to a less restrictive ankle stabilizing brace that she selected over-the-counter. ? ?Recheck in 1 month.  She is scheduled to return to work April 18.  She should check back right before then. ? ? ?PDMP not reviewed this encounter. ?No orders of the defined types were placed in this encounter. ? ?Meds ordered this encounter  ?Medications  ? nitroGLYCERIN (NITRODUR - DOSED IN MG/24 HR) 0.2 mg/hr patch  ?  Sig: Place 1/4 to 1/2 of a patch over affected region. Remove and replace once daily.  Slightly alter skin placement daily  ?  Dispense:  30 patch  ?  Refill:  1  ?  For musculoskeletal purposes.  Okay to cut patch.  ? ? ? ?Discussed warning signs or symptoms. Please see discharge instructions. Patient expresses understanding. ? ? ?The above documentation has been reviewed and is accurate and complete [name], M.D. ? ?Total encounter time 20 minutes including face-to-face time with the patient and, reviewing past medical record, and charting on the date of service.   ?Reviewed MRI findings discussed treatment plan and options. ? ? ?

## 2022-01-21 NOTE — Progress Notes (Signed)
Ankle MRI shows multiple partial tears of the peroneal brevis tendon.  This is on the outside part of the ankle.  You also have tendinitis of the tendons along the inside part of the ankle without tears. ?You also has some arthritis on the ankle and midfoot. ?Recommend return to clinic to talk about the results of full detail and talk about treatment plan and options. ?

## 2022-01-22 ENCOUNTER — Encounter: Payer: 59 | Admitting: Physical Therapy

## 2022-01-24 ENCOUNTER — Encounter: Payer: Self-pay | Admitting: Family Medicine

## 2022-01-24 ENCOUNTER — Ambulatory Visit (INDEPENDENT_AMBULATORY_CARE_PROVIDER_SITE_OTHER): Payer: 59 | Admitting: Family Medicine

## 2022-01-24 ENCOUNTER — Other Ambulatory Visit: Payer: Self-pay

## 2022-01-24 VITALS — BP 140/90 | HR 90 | Ht 65.0 in | Wt 251.4 lb

## 2022-01-24 DIAGNOSIS — M25572 Pain in left ankle and joints of left foot: Secondary | ICD-10-CM

## 2022-01-24 DIAGNOSIS — M76822 Posterior tibial tendinitis, left leg: Secondary | ICD-10-CM | POA: Diagnosis not present

## 2022-01-24 MED ORDER — NITROGLYCERIN 0.2 MG/HR TD PT24: MEDICATED_PATCH | TRANSDERMAL | 1 refills | Status: DC

## 2022-01-24 NOTE — Patient Instructions (Signed)
Good to see you today. ? ?Con't PT and home exercises. ? ?Nitroglycerin Protocol ? ?Apply 1/4 nitroglycerin patch to affected area daily. ?Change position of patch within the affected area every 24 hours. ?You may experience a headache during the first 1-2 weeks of using the patch, these should subside. ?If you experience headaches after beginning nitroglycerin patch treatment, you may take your preferred over the counter pain reliever. ?Another side effect of the nitroglycerin patch is skin irritation or rash related to patch adhesive. ?Please notify our office if you develop more severe headaches or rash, and stop the patch. ?Tendon healing with nitroglycerin patch may require 12 to 24 weeks depending on the extent of injury. ?Men should not use if taking Viagra, Cialis, or Levitra.  ?Do not use if you have migraines or rosacea.   ? ?Follow-up prior to 02/25/22. ?

## 2022-01-27 NOTE — Therapy (Incomplete)
?OUTPATIENT PHYSICAL THERAPY TREATMENT NOTE ?Progress Note ? ? ?Patient Name: Ashley Weaver ?MRN: [id number] ?DOB:26-Oct-1976, 46 y.o., female ?Today's Date: 01/27/2022 ? ?PCP: Pcp, No ?REFERRING PROVIDER: Gregor Hams, MD ? ?Progress Note ?Reporting Period 12/31/2021 to 01/27/2022 ? ? ?See note below for Objective Data and Assessment of Progress/Goals.  ? ? ? ? ? ? ? ? ? ? ?Past Medical History:  ?Diagnosis Date  ? Diabetes mellitus without complication ([place])   ? Hypertension   ? ?Past Surgical History:  ?Procedure Laterality Date  ? CHOLECYSTECTOMY    ? ?Patient Active Problem List  ? Diagnosis Date Noted  ? 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 Morbid obesity ([place]) 03/08/2019  ? High blood pressure 11/22/2017  ? ? ?REFERRING DIAG: M25.572 (ICD-10-CM) - Acute left ankle pain  ?  ? ?THERAPY DIAG:  ?No diagnosis found. ? ?PERTINENT HISTORY: Pt with left Bakers cyst rupture per vascular ultrasound ?PMH: DM, HTN ? ?PRECAUTIONS: none ? ?SUBJECTIVE:  ?Henretter reports Dr. Georgina Snell ordered an MRI as he was expecting her to be further along.  She reports good HEP compliance. ? ?PAIN:  ?Are you having pain? Yes ?NPRS scale: 3-4/10 ?Pain location: left medial, posterior ankle  ?Pain orientation: Left  ?PAIN TYPE: aching and sharp ?Pain description: constant  ?Aggravating factors: walking, wt bearing, sleep is better but can still be interrupted ?Relieving factors: sleeping, resting, prednisone ?  ? ?OBJECTIVE:  ?  ?DIAGNOSTIC FINDINGS: ruptured baker's cyst left knee,  ?  ?PATIENT SURVEYS:  ? 12/31/2021: FOTO 37%, predicted 68% ?  ?COGNITION: ?         Overall cognitive status: Within functional limits for tasks assessed              ?          ?SENSATION: ?         Light touch: Appears intact ?  ?  ?POSTURE:  ?Rounded shoulder, forward head ?  ?PALPATION: ?TTP; posterior ankle, medial ankle ?  ? EDEMA:  ?Rt ankle circumference  29 centimeters ?Left ankle circumference 31 centimeters ?  ?LE AROM/PROM: ?   ?AROM ?supine Right ?12/31/2021 Left ?12/31/2021 Left ?01/13/2022 Left ?01/28/2022  ?Knee flexion 126 128 128 128  ?Knee extension -2 hyperextension -2 hyperextension -2 hyperextension -2 hyperextension  ?Ankle dorsiflexion '8 4 5 5  ' ?Ankle plantarflexion '20 10 22 22  ' ?Ankle inversion '25 12 12 12  ' ?Ankle eversion '30 15 15 15  ' ? (Blank rows = not tested) ?  ?PROM ?supine Right ?12/31/2021 Left ?12/31/2021 Left ?01/13/2022 Left ?01/28/2022  ?Knee flexion        ?Knee extension        ?Ankle dorsiflexion '12 6 10 10  ' ?Ankle plantarflexion '24 12 25 25  ' ?Ankle inversion '28 18 25 28  ' ?Ankle eversion 34 20 34 35  ?  ?  ?  ?LE MMT: ?  ?MMT Right ?12/31/2021 Left ?12/31/2021  ?Hip flexion 5/5 5/5  ?Hip extension      ?Hip abduction 5/5 5/5  ?Hip adduction 5/5 5/5  ?Knee flexion 5/5 5/5  ?Knee extension 5/5 5/5  ?Ankle dorsiflexion 5/5 3/5  ?Ankle plantarflexion 5/5 3/5  ?Ankle inversion 5/5 3/5  ?Ankle eversion 5/5 3/5  ? (Blank rows = not tested) ?  ?  ?  ?FUNCTIONAL TESTS:  ?5 times sit to stand: 22 seconds with bilateral UE support ?01/13/2022: 5 time sit to stand: 18 seconds with UE support ?  ? ?GAIT: ? ?  01/13/2022:  ?Distance walked: 200 feet level surfaces ?Assistive device utilized:no device ?Level of assistance:  independence ?Comments: CAM boot left foot ? ?12/31/2021:  ?Distance walked: 50 feet level surfaces ?Assistive device utilized: Crutches single ?Level of assistance: Modified independence ?Comments: CAM boot left foot ?  ? ?TODAY'S TREATMENT:  ? ?01/28/2022:  ?Seated achilles stretch Left with strap holding 2 minutes ?Seated rocker board: 2 minutes each direction ?BAPS: L3 2 minutes each direction ?Seated: left ankle 4 way ankle exercises with Level 2 Theraband x 15 each ?PROM: left ankle all directions ?Vasopneumatic: 10 minutes, left ankle, 34 degrees, medium compression ? ? ? ?01/16/2022: ?Seated heel cords stretch with belt 4X 20 seconds (slight bend in knee to avoid hyperextension) ? ?Towel inversion/eversion with 5#  weight 5X each ? ?Ankle inversion/eversion Red 15X each with slow eccentrics ? ?Seated heel cords box 3X 1 minute ? ?Ankle Isometrics Inversion/Eversion 10X 5 seconds each seated ?BAPS board [place] ? ?Discussed standing heel to toe raise with proper footwear ? ?Vasopneumatic L ankle 10 minutes 34* Medium pressure ? ? ?01/13/2022:  ?Seated achilles stretch Left with strap holding 2 minutes ?Seated rocker board: 2 minutes each direction ?BAPS: L2 2 minutes each direction ?Seated: left ankle 4 way ankle exercises with Level 1 Theraband x 15 each ?PROM: left ankle all directions ?Vasopneumatic: 10 minutes, left ankle, 34 degrees, medium compression ? ? ?01/10/2022 ?Seated heel cords stretch with belt 4X 20 seconds (slight bend in knee to avoid hyperextension) ?Towel inversion/eversion with 5# weight 5X each ?Ankle inversion/eversion yellow 2 sets of 10 with slow eccentrics ?Seated ankle dorsiflexion stretch (slight toe in while seated, slide ankle/heel back to stretch soleus) 10X 20 seconds ? ?BAPS board [place] and [place] ? ?Discussed standing heel to toe raise with proper footwear ? ?Vasopneumatic L ankle 10 minutes 34* Medium pressure ? ? ?  ?PATIENT EDUCATION:  ?Education details: PT POC, HEP ?Person educated: Patient ?Education method: Explanation, Demonstration, Tactile cues, Verbal cues, and Handouts ?Education comprehension: verbalized understanding and returned demonstration ?  ?  ?HOME EXERCISE PROGRAM: ? Access Code: [email or web address] ?URL: [email or web address] ?Date: 01/10/2022 ?Prepared by: Vista Mink ? ?Exercises ?Seated Ankle Alphabet - 3 x daily - 7 x weekly - 1 sets ?Towel Scrunches - 3 x daily - 7 x weekly - 3 reps ?Long Sitting Calf Stretch with Strap - 3 x daily - 7 x weekly - 3 sets - 3 reps - 30 seconds hold ?Ankle Inversion Eversion Towel Slide - 3 x daily - 7 x weekly - 2 sets - 10 reps ?Seated Ankle Pumps - 3 x daily - 7 x weekly - 2 sets - 10 reps ?Ankle Inversion with Resistance - 1 x  daily - 7 x weekly - 2-3 sets - 10 reps ?Ankle Eversion with Resistance - 1 x daily - 7 x weekly - 2-3 sets - 10 reps ?Heel Toe Raises with Counter Support - 3-5 x daily - 7 x weekly - 1 sets - 3-10 reps - 3 seconds hold ? ? ?ASSESSMENT: ?  ?CLINICAL IMPRESSION: ? ?01/28/2022:  ?  ?  ? ? ?01/16/2022: Liz is waiting on her MRI to be scheduled per Dr. Clovis Riley order.  I encouraged her to bring a shoe to PT to progress appropriate AROM (standing dorsiflexion stretching) and strength (heel to toe raises, tandem balance).  Continue her current POC to meet short and long term goals while awaiting MRI results. ? ? ?01/13/2022:  ?Pt tolerating exercises well with  improvements in active and passive ROM. Pt still reporting 5/10 pain. Pt still amb in a CAM boot on her left foot with no device needed. Continue skilled PT progressing toward LTG's set.  ? ? ?01/10/2022 ?Breena reports good HEP compliance.  We discussed progressing heel toe raises (with UE support) PRN in a shoe, keeping an eye on swelling and pain to complement her current program.  Strength and dorsiflexion AROM emphasis currently to decrease edema, pain, improve strength and WB function. ? ? ?OBJECTIVE IMPAIRMENTS decreased activity tolerance, decreased balance, decreased mobility, difficulty walking, decreased ROM, decreased strength, increased edema, impaired flexibility, and pain.  ?  ?ACTIVITY LIMITATIONS community activity, occupation, yard work, and shopping.  ?  ?PERSONAL FACTORS 1-2 comorbidities: DM, HTN  are also affecting patient's functional outcome.  ?  ?  ?REHAB POTENTIAL: Good ?  ?CLINICAL DECISION MAKING: Stable/uncomplicated ?  ?EVALUATION COMPLEXITY: Low ?  ?  ?GOALS: ?Goals reviewed with patient? Yes ?  ?SHORT TERM GOALS: ?  ?STG Name Target Date Goal status  ?1 Independent with initial HEP ?Baseline:  01/21/2022 MET ?01/28/2022  ?2 Pt improve 5 time sit to stand to </= 15 seconds with no UE support. ?Baseline:  01/21/2022 Ongoing ?01/07/2022  ?          ?  ?LONG TERM GOALS:  ?  ?LTG Name Target Date Goal status  ?1 Independent with final HEP ?Baseline: 02/11/2022 Ongoing ?01/28/2022  ?2 FOTO score improved to 67% for improved function ?Baseline: 38% on 12/31/2021

## 2022-01-28 ENCOUNTER — Encounter: Payer: 59 | Admitting: Physical Therapy

## 2022-01-28 ENCOUNTER — Telehealth: Payer: Self-pay | Admitting: Physical Therapy

## 2022-01-28 NOTE — Telephone Encounter (Signed)
I called pt to follow up about her wish to cancel her PT appointments. I left a message for pt to call the clinic at [phone] to follow up with her HEP and possibly schedule a follow up visit for reassessing pt's ROM and strength in a few weeks.  ? ?Narda Amber, PT, MPT ?01/28/22 11:05 AM ? ? ?

## 2022-01-30 ENCOUNTER — Encounter: Payer: 59 | Admitting: Rehabilitative and Restorative Service Providers"

## 2022-02-04 ENCOUNTER — Encounter: Payer: 59 | Admitting: Physical Therapy

## 2022-02-06 ENCOUNTER — Encounter: Payer: 59 | Admitting: Rehabilitative and Restorative Service Providers"

## 2022-02-11 ENCOUNTER — Encounter: Payer: 59 | Admitting: Physical Therapy

## 2022-02-13 ENCOUNTER — Encounter: Payer: 59 | Admitting: Rehabilitative and Restorative Service Providers"

## 2022-02-18 ENCOUNTER — Encounter: Payer: 59 | Admitting: Physical Therapy

## 2022-02-20 ENCOUNTER — Encounter: Payer: Self-pay | Admitting: Rehabilitative and Restorative Service Providers"

## 2022-02-20 ENCOUNTER — Ambulatory Visit (INDEPENDENT_AMBULATORY_CARE_PROVIDER_SITE_OTHER): Payer: 59 | Admitting: Rehabilitative and Restorative Service Providers"

## 2022-02-20 DIAGNOSIS — M25572 Pain in left ankle and joints of left foot: Secondary | ICD-10-CM

## 2022-02-20 DIAGNOSIS — M25672 Stiffness of left ankle, not elsewhere classified: Secondary | ICD-10-CM | POA: Diagnosis not present

## 2022-02-20 DIAGNOSIS — R262 Difficulty in walking, not elsewhere classified: Secondary | ICD-10-CM

## 2022-02-20 DIAGNOSIS — R600 Localized edema: Secondary | ICD-10-CM | POA: Diagnosis not present

## 2022-02-20 NOTE — Progress Notes (Signed)
? ?I, Ashley Weaver, LAT, ATC, am serving as scribe for Dr. [name]. ? ?Ashley Weaver is a 46 y.o. female who presents to [facility] at [facility] today for f/u of L ankle pain due to a peroneal brevis tear and ankle arthritis.  She was last seen by Dr. [name] on 01/24/22 and noted 50-60% improvement in her symptoms. She completed 5 PT sessions and has been doing her HEP per PT.   She was prescribed nitroglycerin patches at her last visit and was advised to transition to a lace-up ankle brace.  Today, pt reports that her L medial ankle con't to improve and notes 75-80%.  She has had one PT session since her last visit w/ Dr. [name]. ? ?She notes however her ankle pain is not resolved sufficiently to return to work.  She works in a Paediatric nurse and has continued pain even with normal activities such as walking more than a few 100 yards.  For example she could not walk across a large parking lot without limping and having a lot of pain.  She has a lot of pain after physical therapy.  She is willing to consider surgery. ? ?Diagnostic testing: L ankle MRI- 01/20/22; L ankle XR- 12/13/21 ? ?Pertinent review of systems: No fevers or chills ? ?Relevant historical information: Diabetes obesity and hypertension. ? ? ?Exam:  ?BP (!) 146/84 (BP Location: Right Arm, Patient Position: Sitting, Cuff Size: Large)   Pulse 87   Ht 5\' 5"  (1.651 m)   Wt 252 lb 3.2 oz (114.4 kg)   SpO2 96%   BMI 41.97 kg/m?  ?General: Well Developed, well nourished, and in no acute distress.  ? ?MSK: Left ankle mild swelling.  Normal motion. ? ? ? ?Lab and Radiology Results ?EXAM: ?MRI OF THE LEFT ANKLE WITHOUT CONTRAST ?  ?TECHNIQUE: ?Multiplanar, multisequence MR imaging of the ankle was performed. No ?intravenous contrast was administered. ?  ?COMPARISON:  Left ankle radiographs 12/13/2021 ?  ?FINDINGS: ?TENDONS ?  ?Peroneal: There is a "chevron configuration" of the peroneus brevis ?tendon at the distal aspect of the  fibula (axial image 18), a ?short-segment longitudinal split tear that extends through both the ?anterior and posterior tendon surfaces. Just proximal to this there ?is a more mild partial-thickness longitudinal split tear extending ?through the posterior wall and possibly portions of the anterior ?wall (axial images 6 through 15). There is additional abnormal ?linear intermediate T2 signal within the midsubstance of the more ?distal peroneus brevis tendon at the level of the superior aspect of ?the anterior process of the calcaneus (axial images 23 through 25) ?tendinosis and possible chronic midsubstance linear tear. The ?peroneus longus tendon is intact. ?  ?Posteromedial: Mild posterior tibial, mild-to-moderate flexor ?digitorum longus, and mild flexor hallucis longus tenosynovitis. ?  ?Anterior: The tibialis anterior, extensor hallucis longus and ?extensor digitorum longus tendons are intact. There is mild fluid ?within the subcutaneous fat just anterior to the extensor digitorum ?longus tendon at the level of the tibiotalar joint space (axial ?image 13). ?  ?Achilles: Intact. ?  ?Plantar Fascia: Intact. ?  ?LIGAMENTS ?  ?Lateral: The anterior and posterior talofibular, anterior and ?posterior tibiofibular, and calcaneofibular ligaments are intact. ?  ?Medial: The tibiotalar deep deltoid and tibial spring ligaments are ?intact. ?  ?CARTILAGE ?  ?Ankle Joint: Mild thinning of the far medial aspect of the talar ?dome cartilage with mild subchondral intermediate T2 signal (coronal ?image 18). Mild tibiotalar, posterior subtalar, and talonavicular ?joint effusions. ?  ?  Subtalar Joints/Sinus Tarsi: Fat is preserved within sinus tarsi. ?  ?Bones: Mild-to-moderate tarsometatarsal and navicular-cuneiform ?cartilage thinning. ?  ?Other: The tarsal tunnel is unremarkable. The Lisfranc ligament ?complex is intact. ?  ?IMPRESSION: ?There are multiple areas of longitudinal split tears within the ?peroneus brevis tendon  including at the level of the distal fibular ?metaphysis, just distal to the fibula, and within the more distal ?tendon. ?  ?Mild-to-moderate flexor digitorum longus mild posterior tibial and ?mild flexor hallucis longus tenosynovitis. ?  ?Mild tibiotalar and mild-to-moderate midfoot cartilage degenerative ?changes. ?  ?  ?Electronically Signed ?  By: Neita Garnet M.D. ?  On: 01/20/2022 14:10 ?  ?I, [name], personally (independently) visualized and performed the interpretation of the images attached in this note. ? ? ? ? ? ?Assessment and Plan: ?46 y.o. female with left ankle pain thought to be due to medial ankle tendinitis.  She is not improving sufficiently with physical therapy and nitroglycerin patch protocol.  Reasonable to consider injection or potential surgical options at this point.  We talked about options and surgical consultation/second opinion is a good idea at this point.  This especially prominent given her tendon tears seen on the lateral ankle on MRI. ?Plan refer to Dr. Lajoyce Corners for surgical consultation and second opinion and recheck based on joint decision making.  Happy to do an injection if necessary or indicated. ? ?Continue out of work for now. ? ? ?PDMP not reviewed this encounter. ?Orders Placed This Encounter  ?Procedures  ? Ambulatory referral to Orthopedic Surgery  ?  Referral Priority:   Routine  ?  Referral Type:   Surgical  ?  Referral Reason:   Specialty Services Required  ?  Referred to Provider:   Nadara Mustard, MD  ?  Requested Specialty:   Orthopedic Surgery  ?  Number of Visits Requested:   1  ? ?No orders of the defined types were placed in this encounter. ? ? ? ?Discussed warning signs or symptoms. Please see discharge instructions. Patient expresses understanding. ? ? ?The above documentation has been reviewed and is accurate and complete [name], M.D. ? ? ?

## 2022-02-20 NOTE — Therapy (Addendum)
OUTPATIENT PHYSICAL THERAPY TREATMENT NOTE Progress Note  /DISCHARGE   Patient Name: Ashley Weaver MRN: [id number] DOB:[birth year], 46 y.o., female Today's Date: 02/20/2022  PCP: Merryl Hacker, No REFERRING PROVIDER: Gregor Hams, MD  Progress Note Reporting Period 12/31/2021 to 02/20/2022   See note below for Objective Data and Assessment of Progress/Goals.       PT End of Session - 02/20/22 1100     Visit Number 6    Number of Visits 13    Date for PT Re-Evaluation 02/11/22    Authorization Type Progress note sent 02/20/2022    PT Start Time 1010    PT Stop Time 1057    PT Time Calculation (min) 47 min    Activity Tolerance Patient tolerated treatment well    Behavior During Therapy WFL for tasks assessed/performed                 Past Medical History:  Diagnosis Date   Diabetes mellitus without complication ([place])    Hypertension    Past Surgical History:  Procedure Laterality Date   CHOLECYSTECTOMY     Patient Active Problem List   Diagnosis Date Noted   Diabetes 1.5, 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  Improvement is noted but edema and functional impairments remain.  PAIN:  Are you having pain? Yes NPRS scale: 3-4/10 Pain location: left medial, posterior ankle  Pain orientation: Left  PAIN TYPE: aching Pain description: constant  Aggravating factors: walking, wt bearing, sleep is better but can still be interrupted Relieving factors:  sleeping, resting, prednisone    OBJECTIVE:    DIAGNOSTIC FINDINGS: ruptured baker's cyst left knee,    PATIENT SURVEYS:  02/20/2022: FOTO 52 (was 37, Goal 68)  12/31/2021: FOTO 37%, predicted 68%   COGNITION:          Overall cognitive status: Within functional limits for tasks assessed                        SENSATION:          Light touch: Appears intact     POSTURE:  Rounded shoulder, forward head   PALPATION: TTP; posterior ankle, medial ankle    EDEMA:  Rt ankle circumference  29 centimeters Left ankle circumference 31 centimeters   LE AROM/PROM:   AROM supine Right 12/31/2021 Left 12/31/2021 Left 01/13/2022 L/R in degrees  Knee flexion 126 128 128   Knee extension -2 hyperextension -2 hyperextension -2 hyperextension   Ankle dorsiflexion _0 6/15  Ankle plantarflexion _1 Ankle inversion _2 Ankle eversion _3 (Blank rows = not tested)   PROM supine Right 12/31/2021 Left 12/31/2021 Left 01/13/2022 L/R in pounds  Knee flexion        Knee extension        Ankle dorsiflexion _4 Ankle plantarflexion _5 Ankle  inversion _0 8.3/33.3  Ankle eversion 34 20 34          23.7/46.0        LE MMT:   MMT Right 12/31/2021 Left 12/31/2021  Hip flexion 5/5 5/5  Hip extension      Hip abduction 5/5 5/5  Hip adduction 5/5 5/5  Knee flexion 5/5 5/5  Knee extension 5/5 5/5  Ankle dorsiflexion 5/5 3/5  Ankle plantarflexion 5/5 3/5  Ankle inversion 5/5 3/5  Ankle eversion 5/5 3/5   (Blank rows = not tested)       FUNCTIONAL TESTS:  5 times sit to stand: 22 seconds with bilateral UE support 01/13/2022: 5 time sit to stand: 18 seconds with UE support    GAIT:   01/13/2022:  Distance walked: 200 feet level surfaces Assistive device utilized:no device Level of assistance:  independence Comments: CAM boot left foot  12/31/2021:  Distance walked: 50 feet level surfaces Assistive device utilized: Crutches single Level of  assistance: Modified independence Comments: CAM boot left foot    TODAY'S TREATMENT:  02/20/2022: Slant board for heel cords stretch knees straight (gastrocnemius) and bent (soleus) 2X 1 minute each  Standing heel to toe raises 2 sets of 15  Neuromuscular re-education: Tandem balance: eyes open; head turning and eyes closed 2X each for 20 seconds.  Single leg stance eyes open 3X 10 seconds each  Functional Activities: Step down off 2 inch step with slow eccentrics 10X B   01/16/2022: Seated heel cords stretch with belt 4X 20 seconds (slight bend in knee to avoid hyperextension)  Towel inversion/eversion with 5# weight 5X each  Ankle inversion/eversion Red 15X each with slow eccentrics  Seated heel cords box 3X 1 minute  Ankle Isometrics Inversion/Eversion 10X 5 seconds each seated BAPS board 20X CW and CCW  Discussed standing heel to toe raise with proper footwear  Vasopneumatic L ankle 10 minutes 34* Medium pressure   01/13/2022:  Seated achilles stretch Left with strap holding 2 minutes Seated rocker board: 2 minutes each direction BAPS: L2 2 minutes each direction Seated: left ankle 4 way ankle exercises with Level 1 Theraband x 15 each PROM: left ankle all directions Vasopneumatic: 10 minutes, left ankle, 34 degrees, medium compression    PATIENT EDUCATION:  Education details: Reviewed reassessment and updated HEP Person educated: Patient Education method: Explanation, Demonstration, Tactile cues, Verbal cues, and Handouts Education comprehension: verbalized understanding and returned demonstration     HOME EXERCISE PROGRAM: Access Code: [email or web address] URL: [email or web address] Date: 02/20/2022 Prepared by: Vista Mink  Exercises - Ankle Inversion with Resistance  - 1 x daily - 7 x weekly - 2-3 sets - 10 reps - Ankle Eversion with Resistance  - 1 x daily - 7 x weekly - 2-3 sets - 10 reps - Heel Toe Raises with Counter Support  - 3-5 x daily - 7 x  weekly - 1 sets - 3-10 reps - 3 seconds hold - Slant Board Gastrocnemius Stretch  - 3 x daily - 7 x weekly - 1 sets - 3 reps - 30-60 hold - Slant Board Soleus Stretch  - 3 x daily - 7 x weekly - 1 sets - 3 reps - 30-60 hold - Tandem Stance  - 2 x daily - 7 x weekly - 1 sets - 4-5 reps - 20 second hold  ASSESSMENT:   CLINICAL IMPRESSION: Brendia is out of the boot and walking without an assistive device.  She notes using a brace when she  is WB much and her ankle swells early in the day with increased WB (very noticeable today).  Reassessment shows improved ankle dorsiflexion AROM while strength will benefit from continued work (progressions made today).  Given significant strength deficits and edema, Nataliyah will benefit from continued supervised PT (her last visit before today was a month ago).  OBJECTIVE IMPAIRMENTS decreased activity tolerance, decreased balance, decreased mobility, difficulty walking, decreased ROM, decreased strength, increased edema, impaired flexibility, and pain.    ACTIVITY LIMITATIONS community activity, occupation, yard work, and shopping.    PERSONAL FACTORS 1-2 comorbidities: DM, HTN  are also affecting patient's functional outcome.      REHAB POTENTIAL: Good   CLINICAL DECISION MAKING: Stable/uncomplicated   EVALUATION COMPLEXITY: Low     GOALS: Goals reviewed with patient? Yes   SHORT TERM GOALS:   STG Name Target Date Goal status  1 Independent with initial HEP Baseline:  01/21/2022 Met 02/20/2022  2 Pt improve 5 time sit to stand to </= 15 seconds with no UE support. Baseline:  01/21/2022 Met 02/20/2022             LONG TERM GOALS:    LTG Name Target Date Goal status  1 Independent with final HEP Baseline: 02/11/2022 Updated  02/20/2022  2 FOTO score improved to 67% for improved function Baseline: 38% on 12/31/2021 02/11/2022 Ongoing (52) 02/20/2022  3 Pt will  improve her left ankle dorsiflexion to >/= 15 degrees actively  for improved  function Baseline: 02/11/2022 Ongoing (6) 02/20/2022  4 Report pain < 2/10 in left ankle when walking community surfaces and  distances for >/= 20 minutes.  Baseline: 02/11/2022 Ongoing 02/20/2022  5 Pt will be able to climb a flight of stairs with step over step gait pattern with single hand rail.  Baseline: 02/11/2022 Ongoing 02/20/2022                          PLAN: PT FREQUENCY: 1x/week   PT DURATION: 6 weeks   PLANNED INTERVENTIONS: Therapeutic exercises, Therapeutic activity, Neuro Muscular re-education, Balance training, Gait training, Patient/Family education, Joint mobilization, Stair training, DME instructions, Dry Needling, Electrical stimulation, Cryotherapy, Moist heat, Taping, Ultrasound, Ionotophoresis 78m/ml Dexamethasone, and Manual therapy. All included unless contraindicated   PLAN FOR NEXT SESSION: Progress strength, balance, stairs work, gait quality with vasopneumatic PRN.        RFarley Ly PT, MPT 02/20/2022, 2:45 PM  PHYSICAL THERAPY DISCHARGE SUMMARY  Visits from Start of Care: 6  Current functional level related to goals / functional outcomes: See note   Remaining deficits: See note   Education / Equipment: HEP   Patient goals were partially met. Patient is being discharged due to not returning since the last visit.  MScot Jun PT, DPT, OCS, ATC 04/24/22  2:01 PM

## 2022-02-21 ENCOUNTER — Ambulatory Visit (INDEPENDENT_AMBULATORY_CARE_PROVIDER_SITE_OTHER): Payer: 59 | Admitting: Family Medicine

## 2022-02-21 ENCOUNTER — Encounter: Payer: Self-pay | Admitting: Family Medicine

## 2022-02-21 VITALS — BP 146/84 | HR 87 | Ht 65.0 in | Wt 252.2 lb

## 2022-02-21 DIAGNOSIS — M76822 Posterior tibial tendinitis, left leg: Secondary | ICD-10-CM

## 2022-02-21 DIAGNOSIS — M25572 Pain in left ankle and joints of left foot: Secondary | ICD-10-CM

## 2022-02-21 NOTE — Patient Instructions (Signed)
Good to see you today. ? ?I've referred you to see Dr. Lajoyce Corners.  His office will call you to schedule but please let us know if you haven't heard from them in one week regarding scheduling. ? ?Follow-up: as needed depending on what Dr. Lajoyce Corners has to say ?

## 2022-03-03 ENCOUNTER — Ambulatory Visit: Payer: 59 | Admitting: Orthopedic Surgery

## 2022-03-11 ENCOUNTER — Encounter: Payer: Self-pay | Admitting: Orthopedic Surgery

## 2022-03-11 ENCOUNTER — Ambulatory Visit: Payer: 59 | Admitting: Orthopedic Surgery

## 2022-03-11 DIAGNOSIS — M76822 Posterior tibial tendinitis, left leg: Secondary | ICD-10-CM | POA: Diagnosis not present

## 2022-03-11 NOTE — Progress Notes (Signed)
? ?Office Visit Note ?  ?Patient: Ashley Weaver           ?Date of Birth: [birth year]           ?MRN: [id number] ?Visit Date: 03/11/2022 ?             ?Requested by: Rodolph Bong, MD ?[address] ?[place],  [place] [address] ?PCP: Pcp, No ? ?Chief Complaint  ?Patient presents with  ? Left Ankle - Pain  ? ? ? ? ?HPI: ?Patient is a 46 year old woman with chronic posterior tibial tendon insufficiency.  She has been undergoing excellent conservative therapy.  She has used a nitroglycerin patch she is currently going to therapy upstairs.  She has used a brace for 2 months.  She has had an MRI scan in March. ? ?Assessment & Plan: ?Visit Diagnoses:  ?1. Posterior tibial tendon dysfunction (PTTD) of left lower extremity   ? ? ?Plan: Recommend that she continue with therapy and strengthening recommended continuing with using a brace such as an ASO.  Discussed that if she fails conservative therapy surgical intervention would be to proceed with a fusion of the talonavicular and subtalar joint.  Discussed that she would be off her foot for about 3 months. ? ?Follow-Up Instructions: Return if symptoms worsen or fail to improve.  ? ?Ortho Exam ? ?Patient is alert, oriented, no adenopathy, well-dressed, normal affect, normal respiratory effort. ?Examination patient has a strong dorsalis pedis pulse with standing her foot is pronated and valgus.  She cannot do a single limb heel raise and has pain reproduced over the posterior tibial tendon.  There is some pain to palpation over the posterior tibial tendon.  The peroneal tendons are nontender to palpation.  She has tenderness to palpation anteriorly over the ankle and some tenderness to palpation over the sinus Tarsi she only has about 10 degrees of inversion and eversion of the subtalar joint.  MRI scan shows inflammation within the tibial talar joint and inflammation around the tendons of the ankle. ? ?Imaging: ?No results found. ?No images are attached to the  encounter. ? ?Labs: ?Lab Results  ?Component Value Date  ? LABURIC 4.1 12/23/2021  ? ? ? ?Lab Results  ?Component Value Date  ? ALBUMIN 3.5 09/06/2011  ? ? ?No results found for: MG ?No results found for: VD25OH ? ?No results found for: PREALBUMIN ? ?  Latest Ref Rng & Units 06/28/2013  ?  3:08 AM 06/28/2013  ?  2:50 AM 09/06/2011  ?  5:38 AM  ?CBC EXTENDED  ?WBC 4.0 - 10.5 K/uL  4.9     ?RBC 3.87 - 5.11 MIL/uL  4.21     ?Hemoglobin 12.0 - 15.0 g/dL 76.5   46.5   03.5    ?HCT 36.0 - 46.0 % 38.0   36.1   42.0    ?Platelets 150 - 400 K/uL  246     ?NEUT# 1.7 - 7.7 K/uL  2.8     ?Lymph# 0.7 - 4.0 K/uL  1.6     ? ? ? ?There is no height or weight on file to calculate BMI. ? ?Orders:  ?No orders of the defined types were placed in this encounter. ? ?No orders of the defined types were placed in this encounter. ? ? ? Procedures: ?No procedures performed ? ?Clinical Data: ?No additional findings. ? ?ROS: ? ?All other systems negative, except as noted in the HPI. ?Review of Systems ? ?Objective: ?Vital Signs: There were no vitals taken  for this visit. ? ?Specialty Comments:  ?No specialty comments available. ? ?PMFS History: ?Patient Active Problem List  ? Diagnosis Date Noted  ? 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 Morbid obesity (HCC) 03/08/2019  ? High blood pressure 11/22/2017  ? ?Past Medical History:  ?Diagnosis Date  ? Diabetes mellitus without complication (HCC)   ? Hypertension   ?  ?Family History  ?Problem Relation Age of Onset  ? Diabetes Father   ?  ?Past Surgical History:  ?Procedure Laterality Date  ? CHOLECYSTECTOMY    ? ?Social History  ? ?Occupational History  ? Not on file  ?Tobacco Use  ? Smoking status: Former  ? Smokeless tobacco: Never  ?Substance and Sexual Activity  ? Alcohol use: Yes  ? Drug use: No  ? Sexual activity: Not on file  ? ? ? ? ? ?

## 2022-03-25 IMAGING — MR MR ANKLE*L* W/O CM
4 of 5 series · 30 of 40 positions shown · non-contrast
Comparison: Left ankle radiographs 12/13/2021

CLINICAL DATA: Ankle pain. Swelling. Pain started walking 1 month
ago.

EXAM:
MRI OF THE LEFT ANKLE WITHOUT CONTRAST
TECHNIQUE: Multiplanar, multisequence MR imaging of the ankle was performed. No
intravenous contrast was administered.

[Series 3: PD fat-sat · axial · 3.0mm · 0.62mm/px · z∈[-48,+74]mm · 8 of 37 slices shown]
[im 1/37]
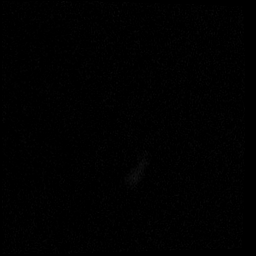
[im 6/37]
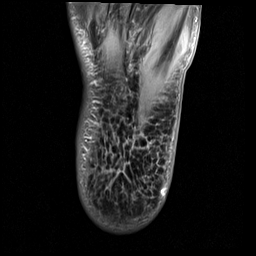
[im 11/37]
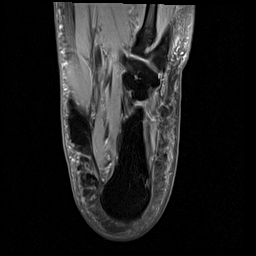
[im 16/37]
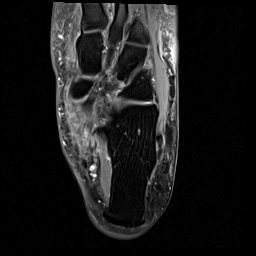
[im 21/37]
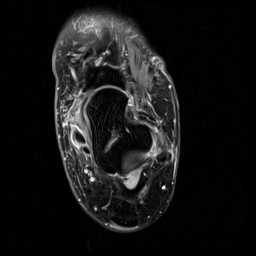
[im 26/37]
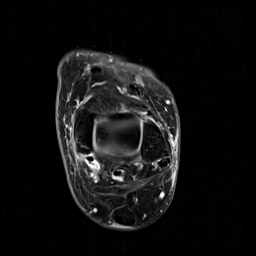
[im 31/37]
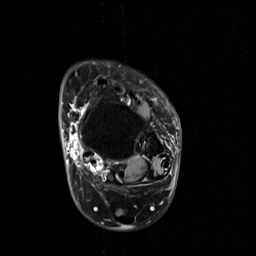
[im 37/37]
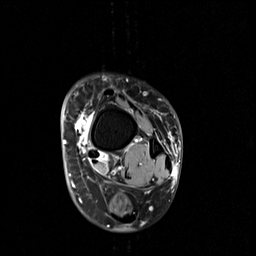

[Series 4: T2 fat-sat · axial · 3.0mm · 0.62mm/px · z∈[-48,+74]mm · 9 of 38 slices shown (1 of 2)]
[im 1/38]
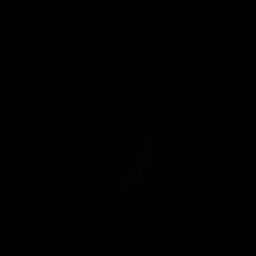
[im 5/38]
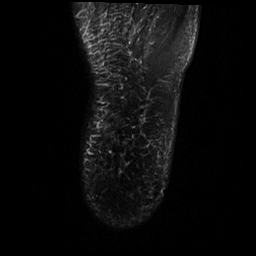
[im 10/38]
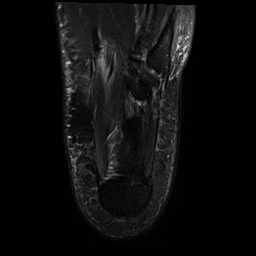
[im 14/38]
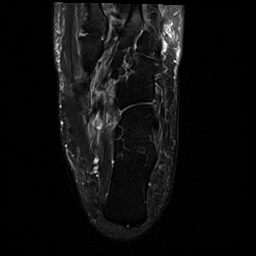
[im 19/38]
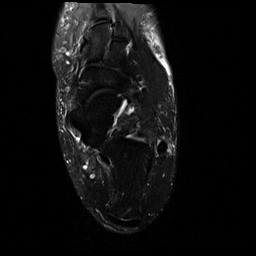
[im 24/38]
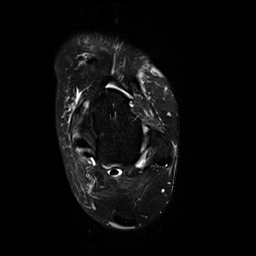
[im 28/38]
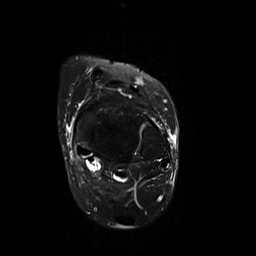
[im 33/38]
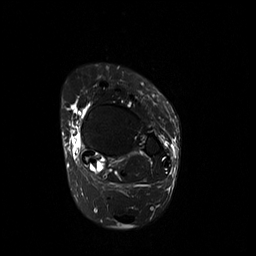
[im 38/38]
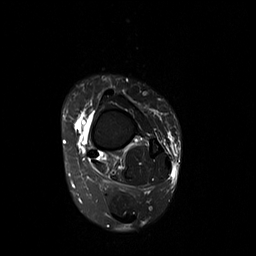

[Series 5: T2 fat-sat · coronal · 3.0mm · 0.31mm/px · 9 of 40 slices shown (2 of 2)]
[im 1/40]
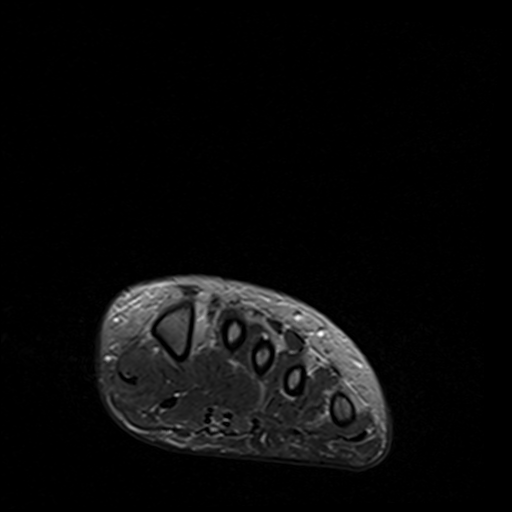
[im 5/40]
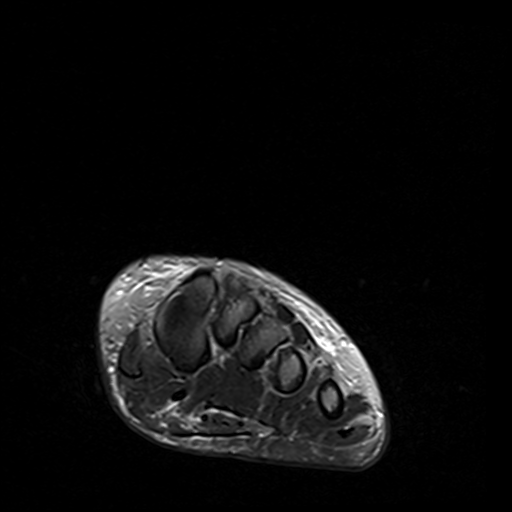
[im 10/40]
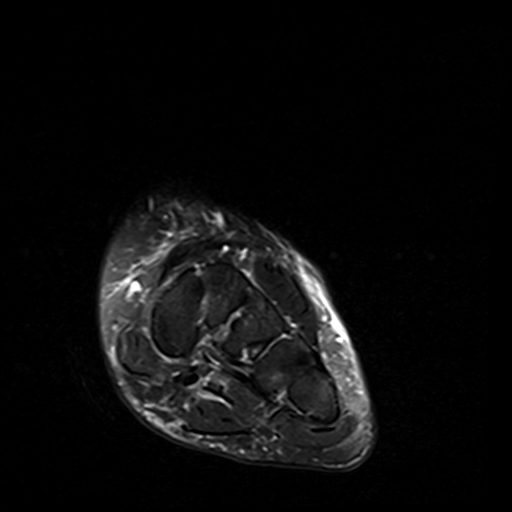
[im 15/40]
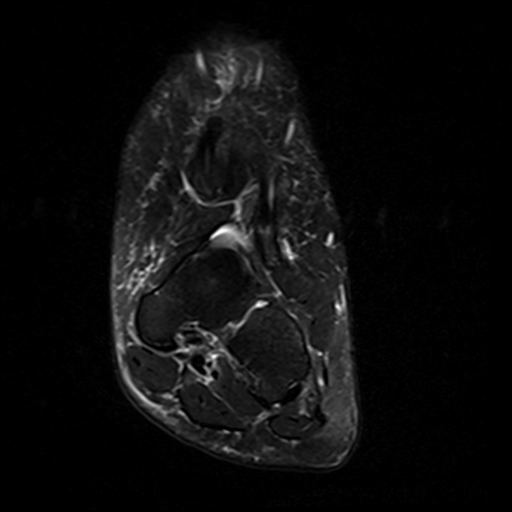
[im 20/40]
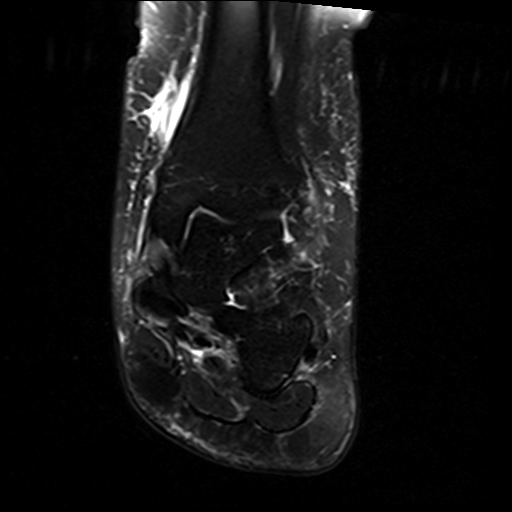
[im 25/40]
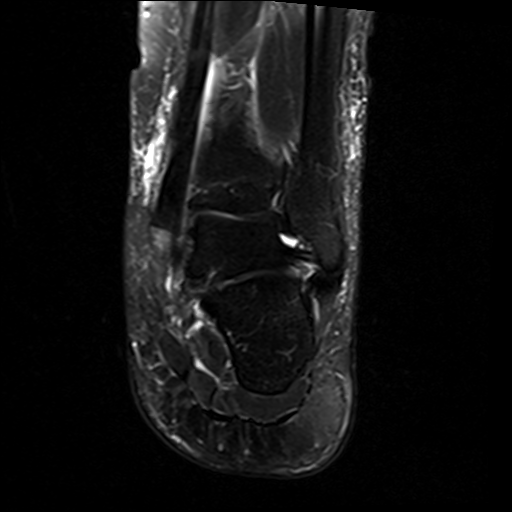
[im 30/40]
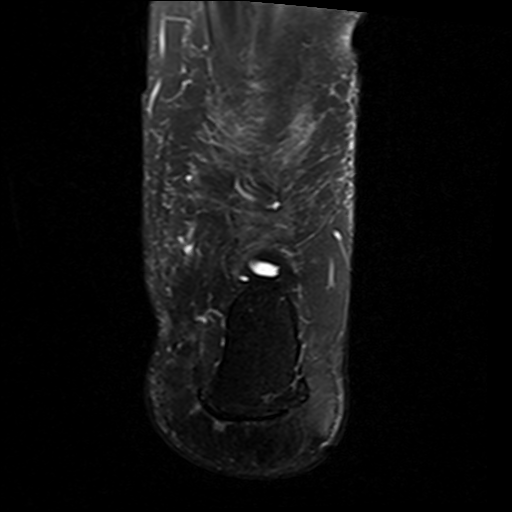
[im 35/40]
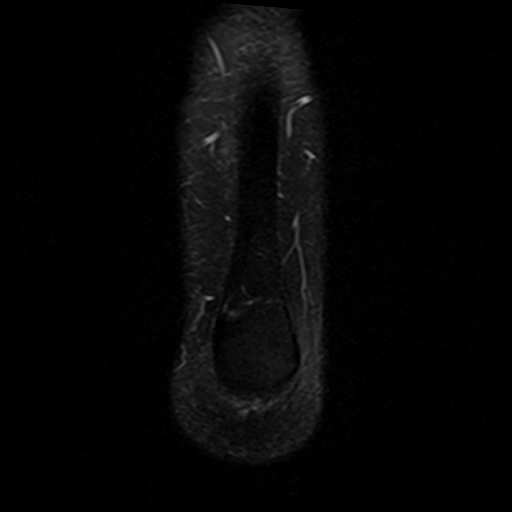
[im 40/40]
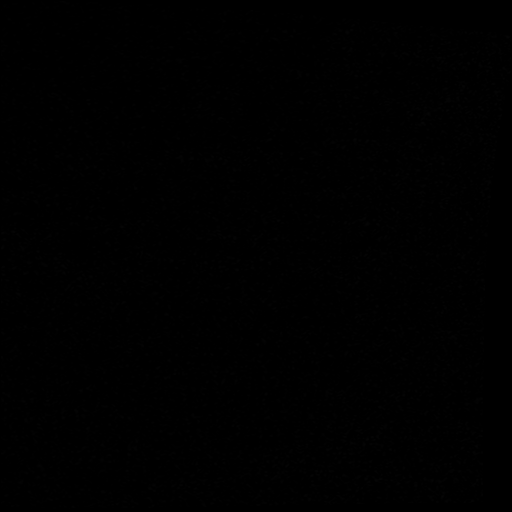

[Series 6: T1 · sagittal · 3.0mm · 0.31mm/px · 4 of 31 slices shown]
[im 1/31]
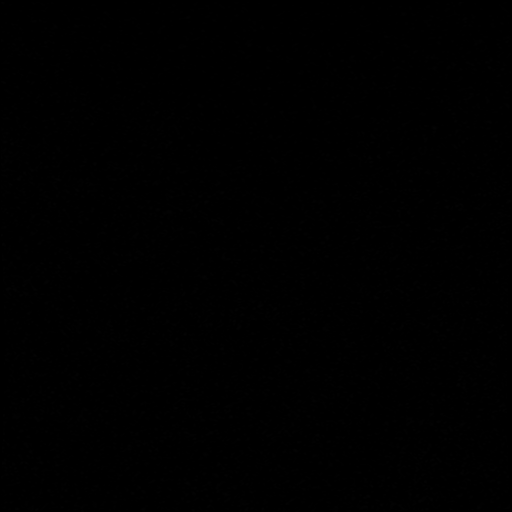
[im 6/31]
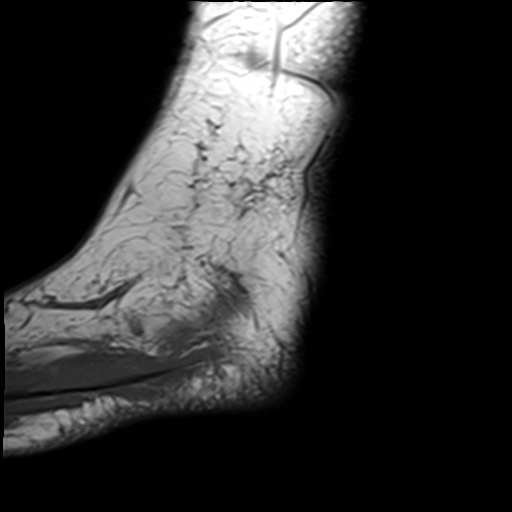
[im 16/31]
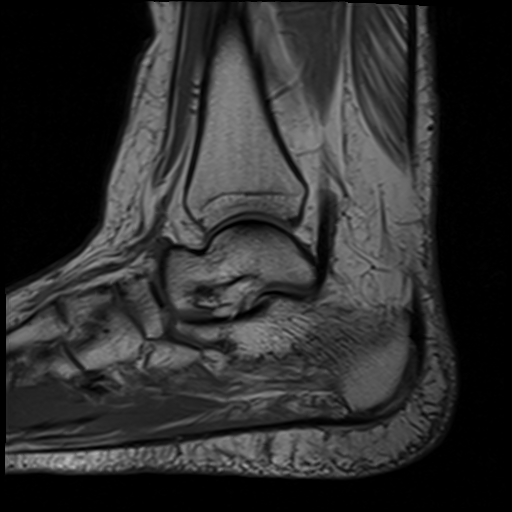
[im 26/31]
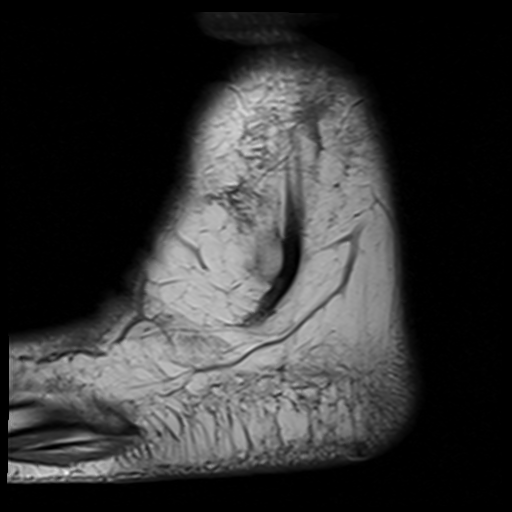

[30 of 40 positions shown; findings below may reference images not displayed]

FINDINGS: TENDONS

Peroneal: There is a "Riehl configuration" of the peroneus brevis
tendon at the distal aspect of the fibula (axial image 18), a
short-segment longitudinal split tear that extends through both the
anterior and posterior tendon surfaces. Just proximal to this there
is a more mild partial-thickness longitudinal split tear extending
through the posterior wall and possibly portions of the anterior
wall (axial images 6 through 15). There is additional abnormal
linear intermediate T2 signal within the midsubstance of the more
distal peroneus brevis tendon at the level of the superior aspect of
the anterior process of the calcaneus (axial images 23 through 25)
tendinosis and possible chronic midsubstance linear tear. The
peroneus longus tendon is intact.

Posteromedial: Mild posterior tibial, mild-to-moderate flexor
digitorum longus, and mild flexor hallucis longus tenosynovitis.

Anterior: The tibialis anterior, extensor hallucis longus and
extensor digitorum longus tendons are intact. There is mild fluid
within the subcutaneous fat just anterior to the extensor digitorum
longus tendon at the level of the tibiotalar joint space (axial
image 13).

Achilles: Intact.

Plantar Fascia: Intact.

LIGAMENTS

Lateral: The anterior and posterior talofibular, anterior and
posterior tibiofibular, and calcaneofibular ligaments are intact.

Medial: The tibiotalar deep deltoid and tibial spring ligaments are
intact.

CARTILAGE

Ankle Joint: Mild thinning of the far medial aspect of the talar
dome cartilage with mild subchondral intermediate T2 signal (coronal
image 18). Mild tibiotalar, posterior subtalar, and talonavicular
joint effusions.

Subtalar Joints/Sinus Tarsi: Fat is preserved within sinus tarsi.

Bones: Mild-to-moderate tarsometatarsal and navicular-cuneiform
cartilage thinning.

Other: The tarsal tunnel is unremarkable. The Lisfranc ligament
complex is intact.
IMPRESSION: There are multiple areas of longitudinal split tears within the
peroneus brevis tendon including at the level of the distal fibular
metaphysis, just distal to the fibula, and within the more distal
tendon.

Mild-to-moderate flexor digitorum longus mild posterior tibial and
mild flexor hallucis longus tenosynovitis.

Mild tibiotalar and mild-to-moderate midfoot cartilage degenerative
changes.

## 2022-05-19 ENCOUNTER — Telehealth: Payer: Self-pay | Admitting: Family Medicine

## 2022-05-19 NOTE — Telephone Encounter (Signed)
I received a letter about medical accommodations or employment accommodations.  What are your current needs?  The last time I saw you was in April and I do not really know what your situation is now and I am afraid that if I were to fill this out I would get it wrong.  I think we may want to schedule a video visit or a phone visit or an in person visit to fill this form out correctly.

## 2022-05-20 NOTE — Telephone Encounter (Signed)
Left message for patient to call back to schedule.  °

## 2022-05-20 NOTE — Telephone Encounter (Signed)
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

## 2022-05-21 NOTE — Telephone Encounter (Signed)
Noted. Will not complete paperwork

## 2022-09-24 ENCOUNTER — Ambulatory Visit
Admission: RE | Admit: 2022-09-24 | Discharge: 2022-09-24 | Disposition: A | Discharge status: Home / Self Care | Payer: Commercial Managed Care - HMO | Source: Ambulatory Visit | Attending: Urgent Care | Admitting: Urgent Care

## 2022-09-24 VITALS — BP 174/110 | HR 76 | Temp 99.2°F | Resp 16

## 2022-09-24 DIAGNOSIS — I10 Essential (primary) hypertension: Secondary | ICD-10-CM | POA: Diagnosis present

## 2022-09-24 DIAGNOSIS — Z79899 Other long term (current) drug therapy: Secondary | ICD-10-CM | POA: Diagnosis not present

## 2022-09-24 DIAGNOSIS — B349 Viral infection, unspecified: Secondary | ICD-10-CM | POA: Diagnosis present

## 2022-09-24 DIAGNOSIS — Z1152 Encounter for screening for COVID-19: Secondary | ICD-10-CM | POA: Insufficient documentation

## 2022-09-24 LAB — RESP PANEL BY RT-PCR (FLU A&B, COVID) ARPGX2
Influenza A by PCR: NEGATIVE
Influenza B by PCR: NEGATIVE
SARS Coronavirus 2 by RT PCR: NEGATIVE

## 2022-09-24 MED ORDER — CETIRIZINE HCL 10 MG PO TABS
10.0000 mg | ORAL_TABLET | Freq: Every day | ORAL | 0 refills | Status: DC
Start: 2022-09-24 — End: 2022-10-10

## 2022-09-24 MED ORDER — IPRATROPIUM BROMIDE 0.03 % NA SOLN: 2.0000 | Freq: Two times a day (BID) | NASAL | 0 refills | Status: DC

## 2022-09-24 MED ORDER — PROMETHAZINE-DM 6.25-15 MG/5ML PO SYRP: 5.0000 mL | ORAL_SOLUTION | Freq: Three times a day (TID) | ORAL | 0 refills | Status: DC | PRN

## 2022-09-24 MED ORDER — BENZONATATE 100 MG PO CAPS: 100.0000 mg | ORAL_CAPSULE | Freq: Three times a day (TID) | ORAL | 0 refills | Status: DC | PRN

## 2022-09-24 NOTE — ED Triage Notes (Signed)
Pt c/o nasal congestion and drainage and chest congestion with cough, HA, sore throat x 3 days-no OTC meds used-NAD-steady gait

## 2022-09-24 NOTE — Discharge Instructions (Signed)
We will notify you of your test results as they arrive and may take between about 24 hours.  I encourage you to sign up for MyChart if you have not already done so as this can be the easiest way for [place] to communicate results to you online or through a phone app.  Generally, we only contact you if it is a positive test result.  In the meantime, if you develop worsening symptoms including fever, chest pain, shortness of breath despite our current treatment plan then please report to the emergency room as this may be a sign of worsening status from possible viral infection.  Otherwise, we will manage this as a viral syndrome. For sore throat or cough try using a honey-based tea. Use 3 teaspoons of honey with juice squeezed from half lemon. Place shaved pieces of ginger into 1/2-1 cup of water and warm over stove top. Then mix the ingredients and repeat every 4 hours as needed. Please take Tylenol 500mg -650mg  every 6 hours for aches and pains, fevers. Hydrate very well with at least 2 liters of water. Eat light meals such as soups to replenish electrolytes and soft fruits, veggies. Start an antihistamine like Zyrtec for postnasal drainage, sinus congestion, use atrovent nasal spray for the same. Use the cough medications as needed.

## 2022-09-24 NOTE — ED Provider Notes (Addendum)
[facility]  Note:  This document was prepared using Conservation officer, historic buildings and may include unintentional dictation errors.  MRN: [id number] DOB: [birth year]  Subjective:   Ashley Weaver is a 46 y.o. female presenting for 3 day history of acute onset persistent throat pain, body aches, chest congestion, coughing, sinus drainage, sinus headaches.  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  No history of respiratory disorders.  Patient is not smoking, no drug use, no vaping.  Has a history of hypertension, takes medication for this.  No current facility-administered medications for this encounter.  Current Outpatient Medications:    hydrochlorothiazide (HYDRODIURIL) 25 MG tablet, Take 1 tablet (25 mg total) by mouth daily., Disp: 30 tablet, Rfl: 2   lisinopril (ZESTRIL) 5 MG tablet, Take 5 mg by mouth daily., Disp: , Rfl:    metFORMIN (GLUCOPHAGE) 1000 MG tablet, Take 1,000 mg by mouth daily with breakfast., Disp: , Rfl:    naproxen sodium (ANAPROX) 220 MG tablet, Take 440 mg by mouth 2 (two) times daily as needed (for pain)., Disp: , Rfl:    neomycin-polymyxin-hydrocortisone (CORTISPORIN) OTIC solution, Place 3 drops into the right ear 4 (four) times daily., Disp: 10 mL, Rfl: 0   nitroGLYCERIN (NITRODUR - DOSED IN MG/24 HR) 0.2 mg/hr patch, Place 1/4 to 1/2 of a patch over affected region. Remove and replace once daily.  Slightly alter skin placement daily, Disp: 30 patch, Rfl: 1   No Known Allergies  Past Medical History:  Diagnosis Date   Diabetes mellitus without complication (HCC)    Hypertension      Past Surgical History:  Procedure Laterality Date   CHOLECYSTECTOMY      Family History  Problem Relation Age of Onset   Diabetes Father     Social History   Tobacco Use   Smoking status: Former    Types: Cigarettes   Smokeless tobacco: Never  Substance Use Topics   Alcohol use: Not Currently   Drug use: No     ROS   Objective:   Vitals: BP (!) 174/110 (BP Location: Right Arm)   Pulse 76   Temp 99.2 F (37.3 C) (Oral)   Resp 16   LMP 09/07/2022 (Approximate)   SpO2 96%   BP Readings from Last 3 Encounters:  09/24/22 (!) 174/110  02/21/22 (!) 146/84  01/24/22 140/90   Physical Exam Constitutional:      General: She is not in acute distress.    Appearance: Normal appearance. She is well-developed and normal weight. She is not ill-appearing, toxic-appearing or diaphoretic.  HENT:     Head: Normocephalic and atraumatic.     Right Ear: Tympanic membrane, ear canal and external ear normal. No drainage or tenderness. No middle ear effusion. There is no impacted cerumen. Tympanic membrane is not erythematous or bulging.     Left Ear: Tympanic membrane, ear canal and external ear normal. No drainage or tenderness.  No middle ear effusion. There is no impacted cerumen. Tympanic membrane is not erythematous or bulging.     Nose: Congestion present. No rhinorrhea.     Mouth/Throat:     Mouth: Mucous membranes are moist. No oral lesions.     Pharynx: No pharyngeal swelling, oropharyngeal exudate, posterior oropharyngeal erythema or uvula swelling.     Tonsils: No tonsillar exudate or tonsillar abscesses.     Comments: Postnasal drainage overlying pharynx. Eyes:     General: No scleral icterus.  Right eye: No discharge.        Left eye: No discharge.     Extraocular Movements: Extraocular movements intact.     Right eye: Normal extraocular motion.     Left eye: Normal extraocular motion.     Conjunctiva/sclera: Conjunctivae normal.  Cardiovascular:     Rate and Rhythm: Normal rate and regular rhythm.     Heart sounds: Normal heart sounds. No murmur heard.    No friction rub. No gallop.  Pulmonary:     Effort: Pulmonary effort is normal. No respiratory distress.     Breath sounds: No stridor. No wheezing, rhonchi or rales.  Chest:     Chest wall: No tenderness.   Musculoskeletal:     Cervical back: Normal range of motion and neck supple.  Lymphadenopathy:     Cervical: No cervical adenopathy.  Skin:    General: Skin is warm and dry.  Neurological:     General: No focal deficit present.     Mental Status: She is alert and oriented to person, place, and time.  Psychiatric:        Mood and Affect: Mood normal.        Behavior: Behavior normal.     Assessment and Plan :   PDMP not reviewed this encounter.  1. Acute viral syndrome   2. Essential hypertension     Continue to monitor blood pressure and maintain compliance with medications.  Follow-up with PCP for recheck.  Deferred imaging given clear cardiopulmonary exam, hemodynamically stable vital signs. COVID and flu test pending.  We will otherwise manage for viral upper respiratory infection.  Physical exam findings reassuring and vital signs stable for discharge. Advised supportive care, offered symptomatic relief. Counseled patient on potential for adverse effects with medications prescribed/recommended today, ER and return-to-clinic precautions discussed, patient verbalized understanding.    Patient will be an excellent candidate for Paxlovid should she test positive for COVID-19.  Recent GFR is consistent with using this particular medication.   Wallis Bamberg, PA-C 09/24/22 1415

## 2022-10-10 ENCOUNTER — Ambulatory Visit
Admission: RE | Admit: 2022-10-10 | Discharge: 2022-10-10 | Disposition: A | Discharge status: Home / Self Care | Payer: Commercial Managed Care - HMO | Source: Ambulatory Visit | Attending: Emergency Medicine | Admitting: Emergency Medicine

## 2022-10-10 VITALS — BP 151/109 | HR 95 | Temp 98.0°F | Resp 16

## 2022-10-10 DIAGNOSIS — H66003 Acute suppurative otitis media without spontaneous rupture of ear drum, bilateral: Secondary | ICD-10-CM | POA: Diagnosis not present

## 2022-10-10 DIAGNOSIS — J309 Allergic rhinitis, unspecified: Secondary | ICD-10-CM

## 2022-10-10 MED ORDER — CEFDINIR 300 MG PO CAPS: 300.0000 mg | ORAL_CAPSULE | Freq: Two times a day (BID) | ORAL | 0 refills | Status: AC

## 2022-10-10 MED ORDER — CETIRIZINE HCL 10 MG PO TABS: 10.0000 mg | ORAL_TABLET | Freq: Every day | ORAL | 1 refills | Status: DC

## 2022-10-10 MED ORDER — FLUTICASONE PROPIONATE 50 MCG/ACT NA SUSP: 1.0000 | Freq: Every day | NASAL | 2 refills | Status: DC

## 2022-10-10 NOTE — ED Triage Notes (Signed)
Patient presents to [facility] for possible ear infection. Pt c/o of right ear pain, right eye irritation x Monday. Hx of ear infections. Not taking anything for pain.   Denies fever.

## 2022-10-10 NOTE — ED Provider Notes (Signed)
[facility]    CSN: [id number] Arrival date & time: 10/10/22  [id number]    HISTORY   Chief Complaint  Patient presents with   Otalgia   HPI Ashley Weaver is a pleasant, 46 y.o. female who presents to urgent care today. Patient complains of possible right ear infection.  Patient states she has pain in her right ear and has noticed that her right eyes irritated as well, states this started today and that ear pain began 4 days ago.  Patient reports a history of ear infections in the past.  Patient states she has not had fever.  Patient states has not tried anything for pain.  The history is provided by the patient.   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    Home Medications    Prior to Admission medications   Medication Sig Start Date End Date Taking? Authorizing Provider  cefdinir (OMNICEF) 300 MG capsule Take 1 capsule (300 mg total) by mouth 2 (two) times daily for 10 days. 10/10/22 10/20/22 Yes Lynden Oxford Scales, PA-C  cetirizine (ZYRTEC ALLERGY) 10 MG tablet Take 1 tablet (10 mg total) by mouth at bedtime. 10/10/22 04/08/23 Yes Lynden Oxford Scales, PA-C  fluticasone (FLONASE) 50 MCG/ACT nasal spray Place 1 spray into both nostrils daily. Begin by using 2 sprays in each nare daily for 3 to 5 days, then decrease to 1 spray in each nare daily. 10/10/22  Yes Lynden Oxford Scales, PA-C  hydrochlorothiazide (HYDRODIURIL) 25 MG tablet Take 1 tablet (25 mg total) by mouth daily. 06/28/13   Junius Creamer, NP  lisinopril (ZESTRIL) 5 MG tablet Take 5 mg by mouth daily.    [provider]  metFORMIN (GLUCOPHAGE)  1000 MG tablet Take 1,000 mg by mouth daily with breakfast.    [provider]  naproxen sodium (ANAPROX) 220 MG tablet Take 440 mg by mouth 2 (two) times daily as needed (for pain).    [provider]  nitroGLYCERIN (NITRODUR - DOSED IN MG/24 HR) 0.2 mg/hr patch Place 1/4 to 1/2 of a patch over affected region. Remove and replace once daily.  Slightly alter skin placement daily 01/24/22   Gregor Hams, MD    Family History Family History  Problem Relation Age of Onset   Diabetes Father    Social History Social History   Tobacco Use   Smoking status: Former    Types: Cigarettes   Smokeless tobacco: Never  Substance Use Topics   Alcohol use: Not Currently   Drug use: No   Allergies   Patient has no known allergies.  Review of Systems Review of Systems Pertinent findings revealed after performing a 14 point review of systems has been noted in the history of present illness.  Physical Exam Triage Vital Signs ED Triage Vitals  Enc Vitals Group     BP 09/06/21 0827 (!) 147/82     Pulse Rate 09/06/21 0827 72     Resp 09/06/21 0827 18     Temp 09/06/21 0827 98.3 F (36.8 C)     Temp Source 09/06/21 0827 Oral  SpO2 09/06/21 0827 98 %     Weight --      Height --      Head Circumference --      Peak Flow --      Pain Score 09/06/21 0826 5     Pain Loc --      Pain Edu? --      Excl. in GC? --   No data found.  Updated Vital Signs BP (!) 151/109 (BP Location: Left Arm)   Pulse 95   Temp 98 F (36.7 C) (Oral)   Resp 16   LMP 09/26/2022 (Approximate)   SpO2 98%   Physical Exam Vitals and nursing note reviewed.  Constitutional:      General: She is not in acute distress.    Appearance: Normal appearance. She is not ill-appearing.  HENT:     Head: Normocephalic and atraumatic.     Salivary Glands: Right salivary gland is not diffusely enlarged or tender. Left salivary gland is not diffusely enlarged or tender.     Right Ear: Hearing, ear canal  and external ear normal. No drainage. A middle ear effusion is present. There is no impacted cerumen. Tympanic membrane is injected, erythematous and bulging.     Left Ear: Hearing, ear canal and external ear normal. No drainage. A middle ear effusion is present. There is no impacted cerumen. Tympanic membrane is injected and bulging. Tympanic membrane is not erythematous.     Nose: Nose normal. No nasal deformity, septal deviation, signs of injury, nasal tenderness, mucosal edema, congestion or rhinorrhea.     Right Nostril: Occlusion present. No foreign body, epistaxis or septal hematoma.     Left Nostril: Occlusion present. No foreign body, epistaxis or septal hematoma.     Right Turbinates: Swollen and pale. Not enlarged.     Left Turbinates: Swollen and pale. Not enlarged.     Right Sinus: No maxillary sinus tenderness or frontal sinus tenderness.     Left Sinus: No maxillary sinus tenderness or frontal sinus tenderness.     Mouth/Throat:     Lips: Pink. No lesions.     Mouth: Mucous membranes are moist. No oral lesions.     Pharynx: Oropharynx is clear. Uvula midline. No pharyngeal swelling, oropharyngeal exudate, posterior oropharyngeal erythema or uvula swelling.     Tonsils: No tonsillar exudate. 0 on the right. 0 on the left.     Comments: Postnasal drip Eyes:     General: Lids are normal.        Right eye: No discharge.        Left eye: No discharge.     Extraocular Movements: Extraocular movements intact.     Conjunctiva/sclera: Conjunctivae normal.     Right eye: Right conjunctiva is not injected.     Left eye: Left conjunctiva is not injected.  Neck:     Trachea: Trachea and phonation normal.  Cardiovascular:     Rate and Rhythm: Normal rate and regular rhythm.     Pulses: Normal pulses.     Heart sounds: Normal heart sounds. No murmur heard.    No friction rub. No gallop.  Pulmonary:     Effort: Pulmonary effort is normal. No tachypnea, bradypnea, accessory muscle usage,  prolonged expiration, respiratory distress or retractions.     Breath sounds: Normal breath sounds. No stridor, decreased air movement or transmitted upper airway sounds. No decreased breath sounds, wheezing, rhonchi or rales.  Chest:     Chest wall: No tenderness.  Musculoskeletal:        General: Normal range of motion.     Cervical back: Full passive range of motion without pain, normal range of motion and neck supple. Normal range of motion.  Lymphadenopathy:     Cervical: Cervical adenopathy present.     Right cervical: Posterior cervical adenopathy present. No superficial or deep cervical adenopathy.    Left cervical: Posterior cervical adenopathy present. No superficial or deep cervical adenopathy.  Skin:    General: Skin is warm and dry.     Findings: No erythema or rash.  Neurological:     General: No focal deficit present.     Mental Status: She is alert and oriented to person, place, and time.  Psychiatric:        Mood and Affect: Mood normal.        Behavior: Behavior normal.     Visual Acuity Right Eye Distance:   Left Eye Distance:   Bilateral Distance:    Right Eye Near:   Left Eye Near:    Bilateral Near:     UC Couse / Diagnostics / Procedures:     Radiology No results found.  Procedures Procedures (including critical care time) EKG  Pending results:  Labs Reviewed - No data to display  Medications Ordered in UC: Medications - No data to display  UC Diagnoses / Final Clinical Impressions(s)   I have reviewed the triage vital signs and the nursing notes.  Pertinent labs & imaging results that were available during my care of the patient were reviewed by me and considered in my medical decision making (see chart for details).    Final diagnoses:  Acute suppurative otitis media of both ears without spontaneous rupture of tympanic membranes, recurrence not specified  Allergic rhinitis, unspecified seasonality, unspecified trigger   Patient advised  of physical exam findings concerning for bilateral otitis media.  Patient provided with a 10-day course of cefdinir for empiric treatment of presumed bacterial infection.  Patient advised of physical exam findings concerning for uncontrolled respiratory allergies, patient provided with prescription for Flonase and cetirizine.  Please see discharge instructions below for further details of plan of care.  Return precautions advised.  ED Prescriptions     Medication Sig Dispense Auth. Provider   fluticasone (FLONASE) 50 MCG/ACT nasal spray Place 1 spray into both nostrils daily. Begin by using 2 sprays in each nare daily for 3 to 5 days, then decrease to 1 spray in each nare daily. 15.8 mL Lynden Oxford Scales, PA-C   cetirizine (ZYRTEC ALLERGY) 10 MG tablet Take 1 tablet (10 mg total) by mouth at bedtime. 90 tablet Lynden Oxford Scales, PA-C   cefdinir (OMNICEF) 300 MG capsule Take 1 capsule (300 mg total) by mouth 2 (two) times daily for 10 days. 20 capsule Lynden Oxford Scales, PA-C      PDMP not reviewed this encounter.  Disposition Upon Discharge:  Condition: stable for discharge home Home: take medications as prescribed; routine discharge instructions as discussed; follow up as advised.  Patient presented with an acute illness with associated systemic symptoms and significant discomfort requiring urgent management. In my opinion, this is a condition that a prudent lay person (someone who possesses an average knowledge of health and medicine) may potentially expect to result in complications if not addressed urgently such as respiratory distress, impairment of bodily function or dysfunction of bodily organs.   Routine symptom specific, illness specific and/or disease specific instructions were discussed with the patient and/or caregiver  at length.   As such, the patient has been evaluated and assessed, work-up was performed and treatment was provided in alignment with urgent care  protocols and evidence based medicine.  Patient/parent/caregiver has been advised that the patient may require follow up for further testing and treatment if the symptoms continue in spite of treatment, as clinically indicated and appropriate.  If the patient was tested for COVID-19, Influenza and/or RSV, then the patient/parent/guardian was advised to isolate at home pending the results of his/her diagnostic coronavirus test and potentially longer if they're positive. I have also advised pt that if his/her COVID-19 test returns positive, it's recommended to self-isolate for at least 10 days after symptoms first appeared AND until fever-free for 24 hours without fever reducer AND other symptoms have improved or resolved. Discussed self-isolation recommendations as well as instructions for household member/close contacts as per the [facility] and [place] DHHS, and also gave patient the [place] packet with this information.  Patient/parent/caregiver has been advised to return to the [facility] or PCP in 3-5 days if no better; to PCP or the Emergency Department if new signs and symptoms develop, or if the current signs or symptoms continue to change or worsen for further workup, evaluation and treatment as clinically indicated and appropriate  The patient will follow up with their current PCP if and as advised. If the patient does not currently have a PCP we will assist them in obtaining one.   The patient may need specialty follow up if the symptoms continue, in spite of conservative treatment and management, for further workup, evaluation, consultation and treatment as clinically indicated and appropriate.  Patient/parent/caregiver verbalized understanding and agreement of plan as discussed.  All questions were addressed during visit.  Please see discharge instructions below for further details of plan.  Discharge Instructions:   Discharge Instructions      Please read below to learn more about the medications, dosages  and frequencies that I recommend to help alleviate your symptoms and to get you feeling better soon:     Omnicef (cefdinir): To resolve the acute infection in your right inner ear and the brewing infection in your left inner ear, please take 1 capsule twice daily for 10 days, you can take it with or without food.  This antibiotic can cause upset stomach, this will resolve once antibiotics are complete.  You are welcome to use a probiotic, eat yogurt, take Imodium while taking this medication.  Please avoid other systemic medications such as Maalox, Pepto-Bismol or milk of magnesia as they can interfere with your body's ability to absorb the antibiotics.       Zyrtec (cetirizine): This is an excellent second-generation antihistamine that helps to reduce respiratory inflammatory response to environmental allergens.  In some patients, this medication can cause daytime sleepiness so I recommend that you take 1 tablet daily at bedtime.  I encourage you to consider taking this allergy medication throughout the winter to help you avoid future respiratory infections.   Flonase (fluticasone): This is a steroid nasal spray that you use once daily, 1 spray in each nare.  This medication does not work well if you decide to use it only used as you feel you need to, it works best used on a daily basis.  After 3 to 5 days of use, you will notice significant reduction of the inflammation and mucus production that is currently being caused by exposure to allergens, whether seasonal or environmental.  The most common side effect of this medication  is nosebleeds.  If you experience a nosebleed, please discontinue use for 1 week, then feel free to resume.  Please consider continuing this allergy medication throughout the winter to help you avoid future respiratory infections.   If you find that you have not had significant relief of your symptoms in the next 7 to 10 days, please follow-up with your primary care provider or  return here to urgent care for repeat evaluation and further recommendations.   Thank you for visiting urgent care today.  We appreciate the opportunity to participate in your care.       This office note has been dictated using Museum/gallery curator.  Unfortunately, this method of dictation can sometimes lead to typographical or grammatical errors.  I apologize for your inconvenience in advance if this occurs.  Please do not hesitate to reach out to me if clarification is needed.      Lynden Oxford Scales, PA-C 10/12/22 1755

## 2022-10-10 NOTE — Discharge Instructions (Signed)
Please read below to learn more about the medications, dosages and frequencies that I recommend to help alleviate your symptoms and to get you feeling better soon:     Omnicef (cefdinir): To resolve the acute infection in your right inner ear and the brewing infection in your left inner ear, please take 1 capsule twice daily for 10 days, you can take it with or without food.  This antibiotic can cause upset stomach, this will resolve once antibiotics are complete.  You are welcome to use a probiotic, eat yogurt, take Imodium while taking this medication.  Please avoid other systemic medications such as Maalox, Pepto-Bismol or milk of magnesia as they can interfere with your body's ability to absorb the antibiotics.       Zyrtec (cetirizine): This is an excellent second-generation antihistamine that helps to reduce respiratory inflammatory response to environmental allergens.  In some patients, this medication can cause daytime sleepiness so I recommend that you take 1 tablet daily at bedtime.  I encourage you to consider taking this allergy medication throughout the winter to help you avoid future respiratory infections.   Flonase (fluticasone): This is a steroid nasal spray that you use once daily, 1 spray in each nare.  This medication does not work well if you decide to use it only used as you feel you need to, it works best used on a daily basis.  After 3 to 5 days of use, you will notice significant reduction of the inflammation and mucus production that is currently being caused by exposure to allergens, whether seasonal or environmental.  The most common side effect of this medication is nosebleeds.  If you experience a nosebleed, please discontinue use for 1 week, then feel free to resume.  Please consider continuing this allergy medication throughout the winter to help you avoid future respiratory infections.   If you find that you have not had significant relief of your symptoms in the next 7 to 10  days, please follow-up with your primary care provider or return here to urgent care for repeat evaluation and further recommendations.   Thank you for visiting urgent care today.  We appreciate the opportunity to participate in your care.

## 2022-10-29 ENCOUNTER — Encounter: Payer: Self-pay | Admitting: Family Medicine

## 2022-10-29 ENCOUNTER — Ambulatory Visit (INDEPENDENT_AMBULATORY_CARE_PROVIDER_SITE_OTHER): Payer: Commercial Managed Care - HMO | Admitting: Family Medicine

## 2022-10-29 VITALS — BP 130/84 | HR 89 | Temp 97.7°F | Ht 65.0 in | Wt 259.2 lb

## 2022-10-29 DIAGNOSIS — E1169 Type 2 diabetes mellitus with other specified complication: Secondary | ICD-10-CM

## 2022-10-29 DIAGNOSIS — E785 Hyperlipidemia, unspecified: Secondary | ICD-10-CM

## 2022-10-29 DIAGNOSIS — K219 Gastro-esophageal reflux disease without esophagitis: Secondary | ICD-10-CM

## 2022-10-29 DIAGNOSIS — E1159 Type 2 diabetes mellitus with other circulatory complications: Secondary | ICD-10-CM

## 2022-10-29 DIAGNOSIS — E139 Other specified diabetes mellitus without complications: Secondary | ICD-10-CM | POA: Diagnosis not present

## 2022-10-29 DIAGNOSIS — Z7689 Persons encountering health services in other specified circumstances: Secondary | ICD-10-CM

## 2022-10-29 DIAGNOSIS — I152 Hypertension secondary to endocrine disorders: Secondary | ICD-10-CM

## 2022-10-29 LAB — POCT GLYCOSYLATED HEMOGLOBIN (HGB A1C): Hemoglobin A1C: 6.4 % — AB (ref 4.0–5.6)

## 2022-10-29 MED ORDER — ROSUVASTATIN CALCIUM 5 MG PO TABS: 2.5000 mg | ORAL_TABLET | Freq: Every day | ORAL | 2 refills | Status: DC

## 2022-10-29 MED ORDER — METFORMIN HCL 1000 MG PO TABS: 1000.0000 mg | ORAL_TABLET | Freq: Every day | ORAL | 0 refills | Status: DC

## 2022-10-29 MED ORDER — HYDROCHLOROTHIAZIDE 25 MG PO TABS: 25.0000 mg | ORAL_TABLET | Freq: Every day | ORAL | 2 refills | Status: DC

## 2022-10-29 MED ORDER — PANTOPRAZOLE SODIUM 40 MG PO TBEC: 40.0000 mg | DELAYED_RELEASE_TABLET | Freq: Every day | ORAL | 3 refills | Status: DC

## 2022-10-29 MED ORDER — HYDROCHLOROTHIAZIDE 25 MG PO TABS: 25.0000 mg | ORAL_TABLET | Freq: Every day | ORAL | 0 refills | Status: DC

## 2022-10-29 MED ORDER — LISINOPRIL 5 MG PO TABS: 5.0000 mg | ORAL_TABLET | Freq: Every day | ORAL | 0 refills | Status: DC

## 2022-10-29 NOTE — Patient Instructions (Signed)
It was a pleasure to see you today! Thank you for choosing [facility] for your primary care. Ashley Weaver was seen for establish care and refills.

## 2022-11-02 NOTE — Progress Notes (Signed)
Assessment/Plan:  Patient establishing care.  Patient diabetes is stable with hemoglobin A1c of 6.4.  We are refilling metformin.  Patient to follow-up in 3 months to recheck and repeat restratification labs including lipid, CMP, hemoglobin A1c  Hypertension.  This is stable.  We are refilling lisinopril and hydrochlorothiazide.  Patient follow-up 3 months  GERD.  Symptoms are stable without any red flag symptoms.  Pantoprazole refilled.  Hyperlipidemia.  Rosuvastatin refilled.  We will recheck fasting labs in 3 months including lipid panel.      Subjective:  HPI:  Patient is a 46 y.o. female who has Diabetes 1.5, managed as type 2 ([facility]); High blood pressure; and Morbid obesity ([facility]) on their problem list..   She  has a past medical history of Diabetes mellitus without complication ([facility]) and Hypertension..   Patient is a new patient here.  She presents with chief complaint of Establish Care (DM concerns. Rx refill Hctz, metformin, lisinopril. Referral to OBGYN) .   Hypertension, established problem, Stable BP Readings from Last 3 Encounters:  10/29/22 130/84  10/10/22 (!) 151/109  09/24/22 (!) 174/110   Current Medications: Lisinopril and HCTZ, compliant without side effects. Interim History:   ROS: Denies any chest pain, shortness of breath, dyspnea on exertion, leg edema.   DIABETES Type II, established problem,  Medications: Metformin 1000 mg, Reports taking and tolerating without side effects.   ROS: Denies Polyuria,Polydipsia, Denies  Hypoglycemia symptoms (palpitations, tremors, anxiousness)   HGBA1C Lab Results  Component Value Date   HGBA1C 6.4 (A) 10/29/2022    Lipid Panel No results found for: "CHOL", "TRIG", "HDL", "LDLCALC"  Renal Function Lab Results  Component Value Date   CREATININE 0.75 12/23/2021   GFR 96.11 12/23/2021    Hyperlipidemia.  Patient has a history of hyperlipidemia for which she takes rosuvastatin.  Denies any  myalgias.  GERD.  Patient has a history of GERD.  Symptoms are stable with pantoprazole Past Surgical History:  Procedure Laterality Date   CHOLECYSTECTOMY      Outpatient Medications Prior to Visit  Medication Sig Dispense Refill   cetirizine (ZYRTEC ALLERGY) 10 MG tablet Take 1 tablet (10 mg total) by mouth at bedtime. 90 tablet 1   fluticasone (FLONASE) 50 MCG/ACT nasal spray Place 1 spray into both nostrils daily. Begin by using 2 sprays in each nare daily for 3 to 5 days, then decrease to 1 spray in each nare daily. 15.8 mL 2   hydrochlorothiazide (HYDRODIURIL) 25 MG tablet Take 1 tablet (25 mg total) by mouth daily. 30 tablet 2   lisinopril (ZESTRIL) 5 MG tablet Take 5 mg by mouth daily.     metFORMIN (GLUCOPHAGE) 1000 MG tablet Take 1,000 mg by mouth daily with breakfast.     naproxen sodium (ANAPROX) 220 MG tablet Take 440 mg by mouth 2 (two) times daily as needed (for pain). (Patient not taking: Reported on 10/29/2022)     nitroGLYCERIN (NITRODUR - DOSED IN MG/24 HR) 0.2 mg/hr patch Place 1/4 to 1/2 of a patch over affected region. Remove and replace once daily.  Slightly alter skin placement daily (Patient not taking: Reported on 10/29/2022) 30 patch 1   No facility-administered medications prior to visit.    Family History  Problem Relation Age of Onset   Diabetes Father     Social History   Socioeconomic History   Marital status: Married    Spouse name: Not on file   Number of children: Not on file   Years of  education: Not on file   Highest education level: Not on file  Occupational History   Not on file  Tobacco Use   Smoking status: Former    Types: Cigarettes    Passive exposure: Never   Smokeless tobacco: Never  Vaping Use   Vaping Use: Not on file  Substance and Sexual Activity   Alcohol use: Not Currently   Drug use: No   Sexual activity: Yes    Birth control/protection: None  Other Topics Concern   Not on file  Social History Narrative   Not on  file   Social Determinants of Health   Financial Resource Strain: Not on file  Food Insecurity: Not on file  Transportation Needs: Not on file  Physical Activity: Not on file  Stress: Not on file  Social Connections: Not on file  Intimate Partner Violence: Not on file                                                                                                 Objective:  Physical Exam: BP 130/84 (BP Location: Right Arm, Patient Position: Sitting, Cuff Size: Large)   Pulse 89   Temp 97.7 F (36.5 C) (Temporal)   Ht 5\' 5"  (1.651 m)   Wt 259 lb 3.2 oz (117.6 kg)   LMP 10/22/2022 (Approximate)   SpO2 99%   BMI 43.13 kg/m    General: No acute distress. Awake and conversant.  Eyes: Normal conjunctiva, anicteric. Round symmetric pupils.  ENT: Hearing grossly intact. No nasal discharge.  Neck: Neck is supple. No masses or thyromegaly.  Respiratory: Respirations are non-labored. No auditory wheezing.  Skin: Warm. No rashes or ulcers.  Psych: Alert and oriented. Cooperative, Appropriate mood and affect, Normal judgment.  CV: No cyanosis or JVD MSK: Normal ambulation. No clubbing  Neuro: Sensation and CN II-XII grossly normal.        Alesia Banda, MD, MS

## 2023-01-12 ENCOUNTER — Other Ambulatory Visit: Payer: Self-pay

## 2023-01-12 ENCOUNTER — Emergency Department (HOSPITAL_COMMUNITY)
Admission: EM | Admit: 2023-01-12 | Discharge: 2023-01-12 | Disposition: A | Discharge status: Home / Self Care | Payer: Commercial Managed Care - HMO | Attending: Emergency Medicine | Admitting: Emergency Medicine

## 2023-01-12 ENCOUNTER — Emergency Department (HOSPITAL_COMMUNITY): Payer: Commercial Managed Care - HMO

## 2023-01-12 DIAGNOSIS — I471 Supraventricular tachycardia, unspecified: Secondary | ICD-10-CM | POA: Insufficient documentation

## 2023-01-12 DIAGNOSIS — I10 Essential (primary) hypertension: Secondary | ICD-10-CM | POA: Insufficient documentation

## 2023-01-12 DIAGNOSIS — Z79899 Other long term (current) drug therapy: Secondary | ICD-10-CM | POA: Insufficient documentation

## 2023-01-12 DIAGNOSIS — R002 Palpitations: Secondary | ICD-10-CM | POA: Diagnosis present

## 2023-01-12 DIAGNOSIS — Z7984 Long term (current) use of oral hypoglycemic drugs: Secondary | ICD-10-CM | POA: Insufficient documentation

## 2023-01-12 DIAGNOSIS — E119 Type 2 diabetes mellitus without complications: Secondary | ICD-10-CM | POA: Insufficient documentation

## 2023-01-12 LAB — BASIC METABOLIC PANEL
Anion gap: 10 (ref 5–15)
BUN: 9 mg/dL (ref 6–20)
CO2: 23 mmol/L (ref 22–32)
Calcium: 8.5 mg/dL — ABNORMAL LOW (ref 8.9–10.3)
Chloride: 104 mmol/L (ref 98–111)
Creatinine, Ser: 0.83 mg/dL (ref 0.44–1.00)
GFR, Estimated: >60 mL/min (ref >60)
Glucose, Bld: 96 mg/dL (ref 70–99)
Potassium: 3.8 mmol/L (ref 3.5–5.1)
Sodium: 137 mmol/L (ref 135–145)

## 2023-01-12 LAB — CBC
HCT: 33.1 % — ABNORMAL LOW (ref 36.0–46.0)
Hemoglobin: 10.7 g/dL — ABNORMAL LOW (ref 12.0–15.0)
MCH: 28.8 pg (ref 26.0–34.0)
MCHC: 32.3 g/dL (ref 30.0–36.0)
MCV: 89.2 fL (ref 80.0–100.0)
Platelets: 211 10*3/uL (ref 150–400)
RBC: 3.71 MIL/uL — ABNORMAL LOW (ref 3.87–5.11)
RDW: 12.7 % (ref 11.5–15.5)
WBC: 3.7 10*3/uL — ABNORMAL LOW (ref 4.0–10.5)
nRBC: 0 % (ref 0.0–0.2)

## 2023-01-12 LAB — MAGNESIUM: Magnesium: 1.9 mg/dL (ref 1.7–2.4)

## 2023-01-12 LAB — TSH: TSH: 0.828 u[IU]/mL (ref 0.350–4.500)

## 2023-01-12 MED ORDER — SODIUM CHLORIDE 0.9 % IV BOLUS
1000.0000 mL | Freq: Once | INTRAVENOUS | Status: AC
  Administered 2023-01-12: 1000 mL via INTRAVENOUS

## 2023-01-12 NOTE — ED Notes (Signed)
Pt resting on stretcher with family at bedside NAD vss will continue to monitor

## 2023-01-12 NOTE — Discharge Instructions (Addendum)
You were evaluated today and diagnosed with supraventricular tachycardia.  Please avoid stimulants until you are evaluated by cardiology.  Cardiology should contact you to schedule an appointment.  If you do not hear from them within the next 3 days please contact them to schedule an appointment.  If you have further episodes of palpitations please blow on the straw as I described to him.  If this does not help please immediately call 911 for further evaluation management.

## 2023-01-12 NOTE — ED Provider Notes (Signed)
[facility] Provider Note   CSN: [id number] Arrival date & time: 01/12/23  1356     History {Add pertinent medical, surgical, social history, OB history to HPI:1} Chief Complaint  Patient presents with   Palpitations    Ashley Weaver is a 47 y.o. female.  Patient presents emergency room complaining of palpitations.  Patient states that at work today she started to feel lightheaded.  She believes she may be having a blood sugar drop and eat something from a daily at work.  She says she continued to feel lightheaded and began to experience palpitations.  EMS was called and found the patient to be in SVT.  Vagal maneuvers were unsuccessful.  The rate was 230.  The patient received two 6 mg doses of adenosine and converted to sinus rhythm.  Patient currently denies chest pain, shortness of breath, palpitations.  Patient is currently in normal sinus rhythm.  Past medical history includes diabetes 1.5, hypertension, obesity  HPI     Home Medications Prior to Admission medications   Medication Sig Start Date End Date Taking? Authorizing Provider  cetirizine (ZYRTEC ALLERGY) 10 MG tablet Take 1 tablet (10 mg total) by mouth at bedtime. 10/10/22 04/08/23  Lynden Oxford Scales, PA-C  fluticasone (FLONASE) 50 MCG/ACT nasal spray Place 1 spray into both nostrils daily. Begin by using 2 sprays in each nare daily for 3 to 5 days, then decrease to 1 spray in each nare daily. 10/10/22   Lynden Oxford Scales, PA-C  hydrochlorothiazide (HYDRODIURIL) 25 MG tablet Take 1 tablet (25 mg total) by mouth daily. 10/29/22 01/27/23  Bonnita Hollow, MD  lisinopril (ZESTRIL) 5 MG tablet Take 1 tablet (5 mg total) by mouth daily. 10/29/22 01/27/23  Bonnita Hollow, MD  metFORMIN (GLUCOPHAGE) 1000 MG tablet Take 1 tablet (1,000 mg total) by mouth daily with breakfast. 10/29/22 01/27/23  Bonnita Hollow, MD  pantoprazole (PROTONIX) 40 MG tablet Take 1 tablet (40 mg  total) by mouth daily. 10/29/22   Bonnita Hollow, MD  rosuvastatin (CRESTOR) 5 MG tablet Take 0.5 tablets (2.5 mg total) by mouth daily. 10/29/22 01/27/23  Bonnita Hollow, MD      Allergies    Patient has no known allergies.    Review of Systems   Review of Systems  Respiratory:  Negative for shortness of breath.   Cardiovascular:  Positive for chest pain and palpitations.  Gastrointestinal:  Negative for abdominal pain, nausea and vomiting.  Genitourinary:  Negative for dysuria.    Physical Exam Updated Vital Signs BP 129/82   Pulse 95   Temp 98.6 F (37 C)   Ht '5\' 5"'$  (1.651 m)   Wt 122.5 kg   SpO2 100%   BMI 44.93 kg/m  Physical Exam Vitals and nursing note reviewed.  Constitutional:      General: She is not in acute distress.    Appearance: She is well-developed.  HENT:     Head: Normocephalic and atraumatic.  Eyes:     Conjunctiva/sclera: Conjunctivae normal.  Cardiovascular:     Rate and Rhythm: Normal rate and regular rhythm.     Heart sounds: No murmur heard. Pulmonary:     Effort: Pulmonary effort is normal. No respiratory distress.     Breath sounds: Normal breath sounds.  Abdominal:     Palpations: Abdomen is soft.     Tenderness: There is no abdominal tenderness.  Musculoskeletal:        General: No  swelling.     Cervical back: Neck supple.  Skin:    General: Skin is warm and dry.     Capillary Refill: Capillary refill takes less than 2 seconds.  Neurological:     Mental Status: She is alert.  Psychiatric:        Mood and Affect: Mood normal.     ED Results / Procedures / Treatments   Labs (all labs ordered are listed, but only abnormal results are displayed) Labs Reviewed  BASIC METABOLIC PANEL  MAGNESIUM  CBC  TSH  I-STAT BETA HCG BLOOD, ED ([facility], [facility], [facility] ONLY)    EKG EKG Interpretation  Date/Time:  Monday January 12 2023 13:57:58 EST Ventricular Rate:  108 PR Interval:  142 QRS Duration: 89 QT Interval:  348 QTC  Calculation: 467 R Axis:   76 Text Interpretation: Sinus tachycardia Right atrial enlargement Minimal ST depression, diffuse leads Confirmed by Margaretmary Eddy [phone]) on 01/12/2023 2:38:15 PM  Radiology DG Chest Portable 1 View  Result Date: 01/12/2023 CLINICAL DATA:  Palpitations. EXAM: PORTABLE CHEST 1 VIEW COMPARISON:  01/09/2012. FINDINGS: Clear lungs. Normal heart size and mediastinal contours. No pleural effusion or pneumothorax. Visualized bones and upper abdomen are unremarkable. IMPRESSION: No evidence of acute cardiopulmonary disease. Electronically Signed   By: Emmit Alexanders M.D.   On: 01/12/2023 14:53    Procedures Procedures  {Document cardiac monitor, telemetry assessment procedure when appropriate:1}  Medications Ordered in ED Medications - No data to display  ED Course/ Medical Decision Making/ A&P   {   Click here for ABCD2, HEART and other calculatorsREFRESH Note before signing :1}                          Medical Decision Making  This patient presents to the ED for concern of palpitations, this involves an extensive number of treatment options, and is a complaint that carries with it a high risk of complications and morbidity.  The differential diagnosis includes SVT, atrial fibrillation, V. tach, others   Co morbidities that complicate the patient evaluation  Diabetes, hypertension   Additional history obtained:  Additional history obtained from EMS External records from outside source obtained and reviewed including office notes from family medicine for diabetic management   Lab Tests:  I Ordered, and personally interpreted labs.  The pertinent results include: Hemoglobin 10.7,   Imaging Studies ordered:  I ordered imaging studies including chest x-ray I independently visualized and interpreted imaging which showed no acute disease I agree with the radiologist interpretation   Cardiac Monitoring: / EKG:  The patient was maintained on a cardiac  monitor.  I personally viewed and interpreted the cardiac monitored which showed an underlying rhythm of: Sinus rhythm   Consultations Obtained:  I requested consultation with the ***,  and discussed lab and imaging findings as well as pertinent plan - they recommend: ***   Problem List / ED Course / Critical interventions / Medication management   I ordered medication including normal saline for fluid resuscitation Reevaluation of the patient after these medicines showed that the patient improved I have reviewed the patients home medicines and have made adjustments as needed    Test / Admission - Considered:  ***   {Document critical care time when appropriate:1} {Document review of labs and clinical decision tools ie heart score, Chads2Vasc2 etc:1}  {Document your independent review of radiology images, and any outside records:1} {Document your discussion with family members, caretakers, and with  consultants:1} {Document social determinants of health affecting pt's care:1} {Document your decision making why or why not admission, treatments were needed:1} Final Clinical Impression(s) / ED Diagnoses Final diagnoses:  None    Rx / DC Orders ED Discharge Orders     None

## 2023-01-12 NOTE — ED Provider Notes (Signed)
I performed initial medical screening exam for the patient.   In brief, 47 y.o. female with a history of palpitations who presents with palpitations, chest pain, and SVT that started while the patient was at work. On exam, is back in normal sinus rhythm after receiving adenosine by EMS.  Blood pressure WNL.  Reports that her chest discomfort has resolved and feel that this was likely rate related.  EMS telemetry strips at the bedside which do show SVT.   Please see additional provider documentation for full H&P and plan.     Fransico Meadow, MD 01/12/23 [phone]

## 2023-01-12 NOTE — ED Triage Notes (Signed)
Pt arrived via ems from work pt reports hx of palpations in the past pt reports palpitations with chest pain initial HR 230 pt received adenosine 6 mg x 2 then converted pt currently denies any complaints

## 2023-01-12 NOTE — ED Notes (Signed)
ED physician at bedside.

## 2023-01-13 ENCOUNTER — Telehealth: Payer: Self-pay

## 2023-01-13 NOTE — Transitions of Care (Post Inpatient/ED Visit) (Signed)
   01/13/2023  Name: Ashley Weaver MRN: [id number] DOB: 02-06-1976  Today's TOC FU Call Status: Today's TOC FU Call Status:: Successful TOC FU Call Competed TOC FU Call Complete Date: 01/13/23  Transition Care Management Follow-up Telephone Call Date of Discharge: 01/12/23 Discharge Facility[facility]) Type of Discharge: Emergency Department Reason for ED Visit: Cardiac Conditions Cardiac Conditions Diagnosis:  (SVT) How have you been since you were released from the hospital?: Better  Items Reviewed: Did you receive and understand the discharge instructions provided?: Yes Medications obtained and verified?: No Medications Not Reviewed Reasons:: Other: (none ordered) Any new allergies since your discharge?: No Dietary orders reviewed?: No Do you have support at home?: Yes People in Home: child(ren), adult, spouse  Home Care and Equipment/Supplies: [place] Ordered?: NA Any new equipment or medical supplies ordered?: NA  Functional Questionnaire: Do you need assistance with bathing/showering or dressing?: No Do you need assistance with meal preparation?: No Do you need assistance with eating?: No Do you have difficulty maintaining continence: No Do you need assistance with getting out of bed/getting out of a chair/moving?: No Do you have difficulty managing or taking your medications?: No  Folllow up appointments reviewed: PCP Follow-up appointment confirmed?: Yes [facility] Follow-up appointment confirmed?: Yes Date of Specialist follow-up appointment?: 01/22/23 Follow-Up Specialty Provider:: Dr. Grandville Silos Do you need transportation to your follow-up appointment?: No Do you understand care options if your condition(s) worsen?: Yes-patient verbalized understanding  SDOH Interventions Today    Flowsheet Row Most Recent Value  SDOH Interventions   Food Insecurity Interventions Intervention Not Indicated  Housing Interventions Intervention  Not Indicated  Transportation Interventions Intervention Not Indicated  Utilities Interventions Intervention Not Indicated  Alcohol Usage Interventions Intervention Not Indicated (Score <7)  Financial Strain Interventions Intervention Not Indicated  Physical Activity Interventions Intervention Not Indicated  Stress Interventions Intervention Not Indicated  Social Connections Interventions Intervention Not Indicated       SIGNATURE Angeline Slim, BSN, RN

## 2023-01-14 ENCOUNTER — Telehealth: Payer: Self-pay | Admitting: Family Medicine

## 2023-01-14 NOTE — Telephone Encounter (Signed)
Caller Name: Cadynce Call back phone #: [phone]  Reason for Call: Pt went to [facility] ED was referred to a cardiologist. The appt is 1 month out. She would like to know if this referral is acceptable to you or can you refer one sooner.

## 2023-01-19 NOTE — Telephone Encounter (Signed)
Left patient a detailed voice message about annotation from PCP and to return call to office for scheduling.

## 2023-01-22 ENCOUNTER — Encounter: Payer: Self-pay | Admitting: Family Medicine

## 2023-01-22 ENCOUNTER — Ambulatory Visit (INDEPENDENT_AMBULATORY_CARE_PROVIDER_SITE_OTHER): Payer: Commercial Managed Care - HMO | Admitting: Family Medicine

## 2023-01-22 VITALS — BP 128/86 | HR 90 | Temp 97.4°F | Wt 260.8 lb

## 2023-01-22 DIAGNOSIS — I471 Supraventricular tachycardia, unspecified: Secondary | ICD-10-CM | POA: Diagnosis not present

## 2023-01-22 NOTE — Progress Notes (Signed)
Assessment/Plan:   Problem List Items Addressed This Visit       Cardiovascular and Mediastinum   SVT (supraventricular tachycardia) - Primary    Patient experienced an initial episode of SVT, which was treated and resolved in the emergency department. Initial workup including EKG and basic lab work was reassuring. She is currently asymptomatic and without any evidence of acute cardiopulmonary issues on recent imaging.  Differential diagnosis:  Primary electrical disorder of the heart (most likely given sudden onset and response to adenosine). Electrolyte imbalance (unlikely given normal labs). Thyroid dysfunction (unlikely given normal TSH). Structural heart disease.  Plan:  Cardiology follow-up as scheduled to assess for potential underlying heart conditions and to discuss long-term management strategies. Monitor and manage contributing risk factors such as hypertension and diabetes to reduce the risk of future cardiac events. Lifestyle modifications including moderation of caffeine and alcohol intake. Educate patient on recognizing symptoms and emergency measures such as Valsalva maneuver. Patient to continue current medications and maintain a healthy diet and exercise regimen. At future visit, consider recheck CBC to monitor for anemia and white blood cell count.       Medications Discontinued During This Encounter  Medication Reason   cetirizine (ZYRTEC ALLERGY) 10 MG tablet    fluticasone (FLONASE) 50 MCG/ACT nasal spray       Subjective:  HPI: Encounter date: 01/22/2023  Ashley Weaver is a 47 y.o. female who has Diabetes 1.5, managed as type 2 ([place]); High blood pressure; Morbid obesity ([place]); and SVT (supraventricular tachycardia) on their problem list..   She  has a past medical history of Allergy (09/2022), Diabetes mellitus without complication ([place]), GERD (gastroesophageal reflux disease) (01/2019), and Hypertension..   CHIEF COMPLAINT: Patient presents  for follow-up after hospitalization due to an episode of supraventricular tachycardia (SVT).  HISTORY OF PRESENT ILLNESS:  SVT. Ashley Weaver is a 47 year old female with a history of Type 2 diabetes, hypertension, and morbid obesity who experienced an episode of SVT on 01/12/2023. An EKG performed on 01/12/23 showed sinus tachycardia and right atrial enlargement with minimal ST depression. She was managed in the emergency department with adenosine, which successfully converted her heart rhythm back to normal. Her labs were largely normal, including basic metabolic panel, and given that this was her first episode of SVT, conservative management was recommended. She was educated on Valsalva maneuvers and lifestyle modifications. She has an upcoming cardiology appointment scheduled for 02/12/2023.  ROS:  Cardiology: Past episode of rapid heartbeat diagnosed as SVT, treated with adenosine, currently asymptomatic. Gastrointestinal: No complaints mentioned. Genitourinary: No complaints mentioned. Endocrine: Managed diabetes.  Past Surgical History:  Procedure Laterality Date   CHOLECYSTECTOMY     TUBAL LIGATION  03/26/2005    Outpatient Medications Prior to Visit  Medication Sig Dispense Refill   hydrochlorothiazide (HYDRODIURIL) 25 MG tablet Take 1 tablet (25 mg total) by mouth daily. 90 tablet 0   lisinopril (ZESTRIL) 5 MG tablet Take 1 tablet (5 mg total) by mouth daily. 90 tablet 0   metFORMIN (GLUCOPHAGE) 1000 MG tablet Take 1 tablet (1,000 mg total) by mouth daily with breakfast. 90 tablet 0   pantoprazole (PROTONIX) 40 MG tablet Take 1 tablet (40 mg total) by mouth daily. 30 tablet 3   rosuvastatin (CRESTOR) 5 MG tablet Take 0.5 tablets (2.5 mg total) by mouth daily. 15 tablet 2   cetirizine (ZYRTEC ALLERGY) 10 MG tablet Take 1 tablet (10 mg total) by mouth at bedtime. (Patient taking differently: Take 10 mg by mouth daily  as needed for allergies.) 90 tablet 1   fluticasone (FLONASE)  50 MCG/ACT nasal spray Place 1 spray into both nostrils daily. Begin by using 2 sprays in each nare daily for 3 to 5 days, then decrease to 1 spray in each nare daily. (Patient taking differently: Place 1 spray into both nostrils daily as needed for allergies.) 15.8 mL 2   No facility-administered medications prior to visit.    Family History  Problem Relation Age of Onset   Diabetes Father    Stroke Mother    Cancer Maternal Grandfather    Varicose Veins Maternal Grandmother    Cancer Paternal Grandfather    Diabetes Paternal Grandmother     Social History   Socioeconomic History   Marital status: Married    Spouse name: Not on file   Number of children: Not on file   Years of education: Not on file   Highest education level: Not on file  Occupational History   Not on file  Tobacco Use   Smoking status: Former    Packs/day: 0    Types: Cigarettes    Passive exposure: Never   Smokeless tobacco: Never  Vaping Use   Vaping Use: Not on file  Substance and Sexual Activity   Alcohol use: Not Currently   Drug use: No   Sexual activity: Yes    Birth control/protection: None  Other Topics Concern   Not on file  Social History Narrative   Not on file   Social Determinants of Health   Financial Resource Strain: Low Risk  (01/13/2023)   Overall Financial Resource Strain (CARDIA)    Difficulty of Paying Living Expenses: Not very hard  Food Insecurity: No Food Insecurity (01/13/2023)   Hunger Vital Sign    Worried About Running Out of Food in the Last Year: Never true    Ran Out of Food in the Last Year: Never true  Transportation Needs: No Transportation Needs (01/13/2023)   PRAPARE - Hydrologist (Medical): No    Lack of Transportation (Non-Medical): No  Physical Activity: Sufficiently Active (01/13/2023)   Exercise Vital Sign    Days of Exercise per Week: 5 days    Minutes of Exercise per Session: 150+ min  Stress: Stress Concern Present  (01/13/2023)   [place]    Feeling of Stress : To some extent  Social Connections: Moderately Isolated (01/13/2023)   Social Connection and Isolation Panel [NHANES]    Frequency of Communication with Friends and Family: More than three times a week    Frequency of Social Gatherings with Friends and Family: Three times a week    Attends Religious Services: Never    Active Member of Clubs or Organizations: No    Attends Archivist Meetings: Never    Marital Status: Married  Human resources officer Violence: Not on file                                                                                                 Objective:  Physical Exam: BP  128/86 (BP Location: Left Arm, Patient Position: Sitting, Cuff Size: Large)   Pulse 90   Temp (!) 97.4 F (36.3 C) (Temporal)   Wt 260 lb 12.8 oz (118.3 kg)   LMP 01/13/2023   SpO2 98%   BMI 43.40 kg/m    Physical Exam Constitutional:      General: She is not in acute distress.    Appearance: Normal appearance. She is not ill-appearing or toxic-appearing.  HENT:     Head: Normocephalic and atraumatic.     Nose: Nose normal. No congestion.  Eyes:     General: No scleral icterus.    Extraocular Movements: Extraocular movements intact.  Cardiovascular:     Rate and Rhythm: Normal rate and regular rhythm.     Pulses: Normal pulses.     Heart sounds: Normal heart sounds.  Pulmonary:     Effort: Pulmonary effort is normal. No respiratory distress.     Breath sounds: Normal breath sounds.  Abdominal:     General: Abdomen is flat. Bowel sounds are normal.     Palpations: Abdomen is soft.  Musculoskeletal:        General: Normal range of motion.  Lymphadenopathy:     Cervical: No cervical adenopathy.  Skin:    General: Skin is warm and dry.     Findings: No rash.  Neurological:     General: No focal deficit present.     Mental Status: She is alert and oriented to  person, place, and time. Mental status is at baseline.  Psychiatric:        Mood and Affect: Mood normal.        Behavior: Behavior normal.        Thought Content: Thought content normal.        Judgment: Judgment normal.    LABS:  A1C at 6.4% Normal thyroid-stimulating hormone (TSH) CBC shows mild leukopenia and anemia Basic metabolic panel normal except for mildly low calcium IMAGING:  Chest X-ray from 01/12/2023 shows no evidence of acute cardiopulmonary disease.  DG Chest Portable 1 View  Result Date: 01/12/2023 CLINICAL DATA:  Palpitations. EXAM: PORTABLE CHEST 1 VIEW COMPARISON:  01/09/2012. FINDINGS: Clear lungs. Normal heart size and mediastinal contours. No pleural effusion or pneumothorax. Visualized bones and upper abdomen are unremarkable. IMPRESSION: No evidence of acute cardiopulmonary disease. Electronically Signed   By: Emmit Alexanders M.D.   On: 01/12/2023 14:53   Recent Results (from the past 2160 hour(s))  POCT glycosylated hemoglobin (Hb A1C)     Status: Abnormal   Collection Time: 10/29/22 10:10 AM  Result Value Ref Range   Hemoglobin A1C 6.4 (A) 4.0 - 5.6 %   HbA1c POC (<> result, manual entry)     HbA1c, POC (prediabetic range)     HbA1c, POC (controlled diabetic range)    TSH     Status: None   Collection Time: 01/12/23  2:39 PM  Result Value Ref Range   TSH 0.828 0.350 - 4.500 uIU/mL    Comment: Performed by a 3rd Generation assay with a functional sensitivity of <=0.01 uIU/mL. Performed at [facility], [place] [address], [place] [address]   CBC     Status: Abnormal   Collection Time: 01/12/23  2:41 PM  Result Value Ref Range   WBC 3.7 (L) 4.0 - 10.5 K/uL   RBC 3.71 (L) 3.87 - 5.11 MIL/uL   Hemoglobin 10.7 (L) 12.0 - 15.0 g/dL   HCT 33.1 (L) 36.0 - 46.0 %  MCV 89.2 80.0 - 100.0 fL   MCH 28.8 26.0 - 34.0 pg   MCHC 32.3 30.0 - 36.0 g/dL   RDW 12.7 11.5 - 15.5 %   Platelets 211 150 - 400 K/uL   nRBC 0.0 0.0 - 0.2 %    Comment:  Performed at [facility] [address] [place], [place] [id number]  Basic metabolic panel     Status: Abnormal   Collection Time: 01/12/23  6:56 PM  Result Value Ref Range   Sodium 137 135 - 145 mmol/L   Potassium 3.8 3.5 - 5.1 mmol/L   Chloride 104 98 - 111 mmol/L   CO2 23 22 - 32 mmol/L   Glucose, Bld 96 70 - 99 mg/dL    Comment: Glucose reference range applies only to samples taken after fasting for at least 8 hours.   BUN 9 6 - 20 mg/dL   Creatinine, Ser 0.83 0.44 - 1.00 mg/dL   Calcium 8.5 (L) 8.9 - 10.3 mg/dL   GFR, Estimated >60 >60 mL/min    Comment: (NOTE) Calculated using the CKD-EPI Creatinine Equation (2021)    Anion gap 10 5 - 15    Comment: Performed at [facility] [address], [place], [place] [address]  Magnesium     Status: None   Collection Time: 01/12/23  6:56 PM  Result Value Ref Range   Magnesium 1.9 1.7 - 2.4 mg/dL    Comment: Performed at [facility] [address] [place], [place] [address]         Alesia Banda, MD, MS

## 2023-01-22 NOTE — Assessment & Plan Note (Signed)
Patient experienced an initial episode of SVT, which was treated and resolved in the emergency department. Initial workup including EKG and basic lab work was reassuring. She is currently asymptomatic and without any evidence of acute cardiopulmonary issues on recent imaging.  Differential diagnosis:  Primary electrical disorder of the heart (most likely given sudden onset and response to adenosine). Electrolyte imbalance (unlikely given normal labs). Thyroid dysfunction (unlikely given normal TSH). Structural heart disease.  Plan:  Cardiology follow-up as scheduled to assess for potential underlying heart conditions and to discuss long-term management strategies. Monitor and manage contributing risk factors such as hypertension and diabetes to reduce the risk of future cardiac events. Lifestyle modifications including moderation of caffeine and alcohol intake. Educate patient on recognizing symptoms and emergency measures such as Valsalva maneuver. Patient to continue current medications and maintain a healthy diet and exercise regimen. At future visit, consider recheck CBC to monitor for anemia and white blood cell count.

## 2023-01-28 ENCOUNTER — Ambulatory Visit: Payer: Commercial Managed Care - HMO | Admitting: Family Medicine

## 2023-02-12 ENCOUNTER — Encounter: Payer: Self-pay | Admitting: Interventional Cardiology

## 2023-02-12 ENCOUNTER — Ambulatory Visit: Payer: Commercial Managed Care - HMO | Attending: Interventional Cardiology | Admitting: Interventional Cardiology

## 2023-02-12 VITALS — BP 142/86 | HR 77 | Ht 65.0 in | Wt 264.0 lb

## 2023-02-12 DIAGNOSIS — I10 Essential (primary) hypertension: Secondary | ICD-10-CM | POA: Diagnosis not present

## 2023-02-12 DIAGNOSIS — I471 Supraventricular tachycardia, unspecified: Secondary | ICD-10-CM

## 2023-02-12 DIAGNOSIS — R600 Localized edema: Secondary | ICD-10-CM | POA: Diagnosis not present

## 2023-02-12 MED ORDER — METOPROLOL TARTRATE 25 MG PO TABS: 25.0000 mg | ORAL_TABLET | Freq: Two times a day (BID) | ORAL | 3 refills | Status: DC

## 2023-02-12 NOTE — Patient Instructions (Signed)
Medication Instructions:  Your physician has recommended you make the following change in your medication: Start metoprolol tartrate 25 mg by mouth twice daily   *If you need a refill on your cardiac medications before your next appointment, please call your pharmacy*   Lab Work: none If you have labs (blood work) drawn today and your tests are completely normal, you will receive your results only by: [place] (if you have MyChart) OR A paper copy in the mail If you have any lab test that is abnormal or we need to change your treatment, we will call you to review the results.   Testing/Procedures: Your physician has requested that you have an echocardiogram. Echocardiography is a painless test that uses sound waves to create images of your heart. It provides your doctor with information about the size and shape of your heart and how well your heart's chambers and valves are working. This procedure takes approximately one hour. There are no restrictions for this procedure. Please do NOT wear cologne, perfume, aftershave, or lotions (deodorant is allowed). Please arrive 15 minutes prior to your appointment time.    Follow-Up: At [facility], you and your health needs are our priority.  As part of our continuing mission to provide you with exceptional heart care, we have created designated Provider Care Teams.  These Care Teams include your primary Cardiologist (physician) and Advanced Practice Providers (APPs -  Physician Assistants and Nurse Practitioners) who all work together to provide you with the care you need, when you need it.  We recommend signing up for the patient portal called "MyChart".  Sign up information is provided on this After Visit Summary.  MyChart is used to connect with patients for Virtual Visits (Telemedicine).  Patients are able to view lab/test results, encounter notes, upcoming appointments, etc.  Non-urgent messages can be sent to your provider as  well.   To learn more about what you can do with MyChart, go to [email or web address].    Your next appointment:   3 -4 month(s)  Provider:   Nicholes Rough, PA-C, Melina Copa, PA-C, Ambrose Pancoast, NP, Ermalinda Barrios, PA-C, Christen Bame, NP, or Richardson Dopp, PA-C         Other Instructions
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
## 2023-03-12 ENCOUNTER — Ambulatory Visit (HOSPITAL_COMMUNITY): Payer: Commercial Managed Care - HMO | Attending: Cardiovascular Disease

## 2023-03-12 DIAGNOSIS — I471 Supraventricular tachycardia, unspecified: Secondary | ICD-10-CM | POA: Diagnosis present

## 2023-03-12 LAB — ECHOCARDIOGRAM COMPLETE
Area-P 1/2: 4.08 cm2
S' Lateral: 2.4 cm

## 2023-05-12 ENCOUNTER — Ambulatory Visit: Payer: Commercial Managed Care - HMO | Admitting: Family Medicine

## 2023-05-12 VITALS — BP 138/76 | HR 75 | Temp 97.9°F | Wt 269.6 lb

## 2023-05-12 DIAGNOSIS — N924 Excessive bleeding in the premenopausal period: Secondary | ICD-10-CM

## 2023-05-12 DIAGNOSIS — K219 Gastro-esophageal reflux disease without esophagitis: Secondary | ICD-10-CM | POA: Insufficient documentation

## 2023-05-12 DIAGNOSIS — E1169 Type 2 diabetes mellitus with other specified complication: Secondary | ICD-10-CM | POA: Diagnosis not present

## 2023-05-12 DIAGNOSIS — E139 Other specified diabetes mellitus without complications: Secondary | ICD-10-CM

## 2023-05-12 DIAGNOSIS — I471 Supraventricular tachycardia, unspecified: Secondary | ICD-10-CM

## 2023-05-12 DIAGNOSIS — E1159 Type 2 diabetes mellitus with other circulatory complications: Secondary | ICD-10-CM | POA: Diagnosis not present

## 2023-05-12 DIAGNOSIS — Z7984 Long term (current) use of oral hypoglycemic drugs: Secondary | ICD-10-CM

## 2023-05-12 DIAGNOSIS — I152 Hypertension secondary to endocrine disorders: Secondary | ICD-10-CM | POA: Diagnosis not present

## 2023-05-12 DIAGNOSIS — G8929 Other chronic pain: Secondary | ICD-10-CM

## 2023-05-12 DIAGNOSIS — Z6841 Body Mass Index (BMI) 40.0 and over, adult: Secondary | ICD-10-CM

## 2023-05-12 DIAGNOSIS — R0789 Other chest pain: Secondary | ICD-10-CM

## 2023-05-12 DIAGNOSIS — E785 Hyperlipidemia, unspecified: Secondary | ICD-10-CM

## 2023-05-12 HISTORY — DX: Type 2 diabetes mellitus with other specified complication: E11.69

## 2023-05-12 LAB — CBC WITH DIFFERENTIAL/PLATELET
Basophils Absolute: 0 10*3/uL (ref 0.0–0.1)
Basophils Relative: 0.6 % (ref 0.0–3.0)
Eosinophils Absolute: 0.1 10*3/uL (ref 0.0–0.7)
Eosinophils Relative: 2 % (ref 0.0–5.0)
HCT: 38.2 % (ref 36.0–46.0)
Hemoglobin: 12.4 g/dL (ref 12.0–15.0)
Lymphocytes Relative: 34.9 % (ref 12.0–46.0)
Lymphs Abs: 1.4 10*3/uL (ref 0.7–4.0)
MCHC: 32.5 g/dL (ref 30.0–36.0)
MCV: 86 fl (ref 78.0–100.0)
Monocytes Absolute: 0.3 10*3/uL (ref 0.1–1.0)
Monocytes Relative: 6.3 % (ref 3.0–12.0)
Neutro Abs: 2.3 10*3/uL (ref 1.4–7.7)
Neutrophils Relative %: 56.2 % (ref 43.0–77.0)
Platelets: 293 10*3/uL (ref 150.0–400.0)
RBC: 4.44 Mil/uL (ref 3.87–5.11)
RDW: 12.9 % (ref 11.5–15.5)
WBC: 4.1 10*3/uL (ref 4.0–10.5)

## 2023-05-12 LAB — POCT GLYCOSYLATED HEMOGLOBIN (HGB A1C): Hemoglobin A1C: 6.9 % — AB (ref 4.0–5.6)

## 2023-05-12 LAB — MICROALBUMIN / CREATININE URINE RATIO
Creatinine,U: 45.8 mg/dL
Microalb Creat Ratio: 1.5 mg/g (ref 0.0–30.0)
Microalb, Ur: <0.7 mg/dL (ref 0.0–1.9)

## 2023-05-12 MED ORDER — LOSARTAN POTASSIUM 50 MG PO TABS: 50.0000 mg | ORAL_TABLET | Freq: Every day | ORAL | 3 refills | Status: DC

## 2023-05-12 MED ORDER — METFORMIN HCL 1000 MG PO TABS: 1000.0000 mg | ORAL_TABLET | Freq: Every day | ORAL | 3 refills | Status: DC

## 2023-05-12 MED ORDER — LIDOCAINE 5 % EX PTCH: 1.0000 | MEDICATED_PATCH | CUTANEOUS | 0 refills | Status: AC

## 2023-05-12 MED ORDER — ROSUVASTATIN CALCIUM 5 MG PO TABS: 5.0000 mg | ORAL_TABLET | Freq: Every day | ORAL | 3 refills | Status: DC

## 2023-05-12 MED ORDER — HYDROCHLOROTHIAZIDE 25 MG PO TABS: 25.0000 mg | ORAL_TABLET | Freq: Every day | ORAL | 3 refills | Status: DC

## 2023-05-12 MED ORDER — PANTOPRAZOLE SODIUM 40 MG PO TBEC: 40.0000 mg | DELAYED_RELEASE_TABLET | Freq: Every day | ORAL | 3 refills | Status: AC

## 2023-05-12 MED ORDER — MELOXICAM 7.5 MG PO TABS: 7.5000 mg | ORAL_TABLET | Freq: Every day | ORAL | 0 refills | Status: AC

## 2023-05-12 NOTE — Assessment & Plan Note (Signed)
Patient experiences acid reflux when not taking Pantoprazole 40mg . -Continue Pantoprazole 40mg  as needed for acid reflux symptoms.  General Health Maintenance / Followup Plans: -Check Complete Blood Count (CBC) at next lab visit. -Follow up in three months for diabetes, blood pressure, and cholesterol check.

## 2023-05-12 NOTE — Assessment & Plan Note (Signed)
Patient is 47 years old with a BMI greater than 40, which increases her risk for endometrial cancer. -Refer to OBGYN for further evaluation and management, including possible blood work, imaging, tissue biopsies, and Pap smears.

## 2023-05-12 NOTE — Assessment & Plan Note (Signed)
Currently on Rosuvastatin 2.5mg  daily. Manufacturer recommends changing to 5mg  daily. -Change Rosuvastatin to 5mg  daily. Patient can try this dose and if side effects occur, can take 5mg  every other day.

## 2023-05-12 NOTE — Assessment & Plan Note (Signed)
Blood pressure slightly elevated at 138/78. Patient has a history of diabetes and is currently on Lisinopril 5mg  and Hydrochlorothiazide 25mg . -Discontinue Lisinopril 5mg  and start Losartan 50mg  for better blood pressure control and kidney protection. -Continue Hydrochlorothiazide 25mg . -Monitor blood pressure closely with medication changes.
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
## 2023-05-12 NOTE — Patient Instructions (Signed)
VISIT SUMMARY:  During your visit, we discussed your ongoing left-sided chest and upper abdominal pain, menstrual irregularities, and your chronic conditions including diabetes, hypertension, and obesity. We also reviewed your medications and made some adjustments to better manage your conditions.  YOUR PLAN:  -LEFT UPPER ABDOMINAL PAIN: This is a chronic, severe pain in your left upper abdomen, possibly related to your muscles or bones. We will order a CT scan of your chest to further investigate the cause of the pain. Depending on the results, we may refer you to a specialist. We will also consider physical therapy and have prescribed topical lidocaine patches and Meloxicam for pain management.  -MENSTRUAL IRREGULARITIES: Given your age and BMI, there is an increased risk for endometrial cancer. We will refer you to an OBGYN for further evaluation and management, which may include blood work, imaging, tissue biopsies, and Pap smears.  -HYPERTENSION: This is a condition where your blood pressure is consistently too high. We will discontinue Lisinopril and start Losartan for better blood pressure control and kidney protection. We will continue Hydrochlorothiazide and monitor your blood pressure closely with these medication changes.  -DIABETES: This is a condition that affects how your body uses blood sugar. Your diabetes is well-controlled with Metformin. We will continue this medication and check your kidney function and cholesterol levels at your next lab visit.  -HYPERLIPIDEMIA: This is a condition where there are high levels of fats in your blood. We will change your Rosuvastatin to 5mg  daily. If side effects occur, you can take 5mg  every other day.  -GASTROESOPHAGEAL REFLUX DISEASE (GERD): This is a condition where stomach acid frequently flows back into the tube connecting your mouth and stomach. You should continue taking Pantoprazole as needed for acid reflux  symptoms.  INSTRUCTIONS:  Please follow the new medication instructions and schedule your follow-up appointments with the OBGYN and for your CT scan. We will check your labs at your next lab visit. Please return in three months for a check on your diabetes, blood pressure, and cholesterol.
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
## 2023-05-12 NOTE — Assessment & Plan Note (Signed)
Well-controlled with Metformin 1000mg  twice daily. Recent Hemoglobin A1c was 6.9. -Continue Metformin 1000mg  twice daily. -Check kidney function and cholesterol levels at next lab visit.

## 2023-05-14 NOTE — Progress Notes (Signed)
Office Visit    Patient Name: Ashley Weaver Date of Encounter: 05/15/2023  Primary Care Provider:  Garnette Gunner, MD Primary Cardiologist:  Lance Muss, MD Primary Electrophysiologist: None   Past Medical History    Past Medical History:  Diagnosis Date   Allergy 09/2022   Diabetes mellitus without complication (HCC)    GERD (gastroesophageal reflux disease) 01/2019   Hyperlipidemia associated with type 2 diabetes mellitus (HCC) 05/12/2023   Hypertension    SVT (supraventricular tachycardia)    Past Surgical History:  Procedure Laterality Date   CHOLECYSTECTOMY     TUBAL LIGATION  03/26/2005    Allergies  No Known Allergies   History of Present Illness    Ashley Weaver  is a 47 year old female with a PMH of SVT, HTN, GERD, DM type II who presents today for 73-month follow-up.  Ashley Weaver was seen initially by Dr. Eldridge Dace on 02/12/2023 by referral of PCP.  She was admitted to the ED on 01/12/2023 due to complaint of palpitations that she felt may be related to a drop in her blood sugar.  EMS was called and patient was found to be in SVT with a rate of 230.  She failed vagal maneuvers and was given adenosine 6 mg x 2 with eventual conversion to sinus rhythm.  She was started on metoprolol and 2D echo was completed that showed normal EF of 60-65% with mild LVH and no significant valvular abnormalities.  She was advised that if SVT recurs or metoprolol is difficult to tolerate EP referral would be placed at that time.  Ashley Weaver presents today with her husband for 25-month follow-up.  Since last being seen in the office patient reports she is doing well with no recurrence of SVT.  She does report occasional palpitations that resolved spontaneously.  She is currently no longer on metoprolol due to increased fatigue and muscle stiffness.  Her blood pressure today is elevated at 144/98 and was 152/98 on recheck.  She was recently seen by her PCP and placed on losartan 50  mg but has not started this dose yet.  During today's visit we discussed the importance of abstaining from triggers for palpitations and tachycardia such as caffeine, alcohol, or increased stress.  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  We also discussed primary and secondary prevention for managing her blood pressure such as increasing physical activity and watching salt in her diet..  Patient denies chest pain, palpitations, dyspnea, PND, orthopnea, nausea, vomiting, dizziness, syncope, edema, weight gain, or early satiety.  Home Medications    Current Outpatient Medications  Medication Sig Dispense Refill   Blood Pressure Monitoring (BLOOD PRESSURE CUFF) MISC 1 each by Does not apply route daily. 1 each 0   hydrochlorothiazide (HYDRODIURIL) 25 MG tablet Take 1 tablet (25 mg total) by mouth daily. 90 tablet 3   lidocaine (LIDODERM) 5 % Place 1 patch onto the skin daily. Remove & Discard patch within 12 hours or as directed by MD 30 patch 0   losartan (COZAAR) 50 MG tablet Take 1 tablet (50 mg total) by mouth daily. 90 tablet 3   meloxicam (MOBIC) 7.5 MG tablet Take 1 tablet (7.5 mg total) by mouth daily. 30 tablet 0   metFORMIN (GLUCOPHAGE) 1000 MG tablet Take 1 tablet (1,000 mg total) by mouth daily with breakfast. 90 tablet 3   pantoprazole (PROTONIX) 40 MG tablet Take 1 tablet (40 mg total) by mouth  daily. 90 tablet 3   rosuvastatin (CRESTOR) 5 MG tablet Take 1 tablet (5 mg total) by mouth daily. 90 tablet 3   No current facility-administered medications for this visit.     Review of Systems  Please see the history of present illness.    (+) Palpitations (+) Lower extremity swelling  All other systems reviewed and are otherwise negative except as noted above.  Physical Exam    Wt Readings from Last 3 Encounters:  05/15/23 272 lb (123.4 kg)  05/12/23 269 lb 9.6 oz (122.3 kg)  02/12/23 264 lb (119.7 kg)   VS: Vitals:   05/15/23  1024 05/15/23 1107  BP: (!) 144/98 (!) 152/98  Pulse: 75   SpO2: 99%   ,Body mass index is 45.26 kg/m.  Constitutional:      Appearance: Healthy appearance. Not in distress.  Neck:     Vascular: JVD normal.  Pulmonary:     Effort: Pulmonary effort is normal.     Breath sounds: No wheezing. No rales. Diminished in the bases Cardiovascular:     Normal rate. Regular rhythm. Normal S1. Normal S2.      Murmurs: There is no murmur.  Edema:    Bilateral +1 nonpitting edema Abdominal:     Palpations: Abdomen is soft non tender. There is no hepatomegaly.  Skin:    General: Skin is warm and dry.  Neurological:     General: No focal deficit present.     Mental Status: Alert and oriented to person, place and time.     Cranial Nerves: Cranial nerves are intact.  EKG/LABS/ Recent Cardiac Studies    ECG personally reviewed by me today -none completed today   Sleep Apnea Evaluation  [facility]  Today's Date: 05/15/2023   Patient Name: Ashley Weaver        DOB: Dec 30, 1975       Height:  5\' 5"  (1.651 m)     Weight: 272 lb (123.4 kg)  BMI: Body mass index is 45.26 kg/m.    Referring Provider: Robin Searing, NP   STOP-BANG RISK ASSESSMENT       05/15/2023   10:51 AM  STOP-BANG  Do you snore loudly? Yes  Do you often feel tired, fatigued, or sleepy during the daytime? Yes  Has anyone observed you stop breathing during sleep? Yes  Do you have (or are you being treated for) high blood pressure? Yes  Recent BMI (Calculated) 45.26  Is BMI greater than 35 kg/m2? 1=Yes  Age older than 47 years old? 0=No  Has large neck size > 40 cm (15.7 in, large female shirt size, large female collar size > 16) Yes  Gender - Female 0=No  STOP-Bang Total Score 6      If STOP-BANG Score ?3 OR two clinical symptoms - patient qualifies for WatchPAT (CPT 95800)      Sleep study ordered due to two (2) of the following clinical symptoms/diagnoses:  Excessive daytime sleepiness  G47.10  Gastroesophageal reflux K21.9  Nocturia R35.1  Morning Headaches G44.221  Difficulty concentrating R41.840  Memory problems or poor judgment G31.84  Personality changes or irritability R45.4  Loud snoring R06.83  Depression F32.9  Unrefreshed by sleep G47.8  Impotence N52.9  History of high blood pressure R03.0  Insomnia G47.00  Sleep Disordered Breathing or Sleep Apnea ICD G47.33        Lab Results  Component Value Date   WBC 4.1 05/12/2023   HGB 12.4 05/12/2023  HCT 38.2 05/12/2023   MCV 86.0 05/12/2023   PLT 293.0 05/12/2023   Lab Results  Component Value Date   CREATININE 0.83 01/12/2023   BUN 9 01/12/2023   NA 137 01/12/2023   K 3.8 01/12/2023   CL 104 01/12/2023   CO2 23 01/12/2023   Lab Results  Component Value Date   ALT 10 09/06/2011   AST 13 09/06/2011   ALKPHOS 67 09/06/2011   BILITOT 0.3 09/06/2011   No results found for: "CHOL", "HDL", "LDLCALC", "LDLDIRECT", "TRIG", "CHOLHDL"  Lab Results  Component Value Date   HGBA1C 6.9 (A) 05/12/2023     Assessment & Plan    1.  History of SVT: -Patient diagnosed with SVT and patient reports no recurrence of sustained tachycardia but does note occasional palpitations that resolved spontaneously. -She did not tolerate metoprolol and noted increased fatigue and muscle stiffness. -She was advised to continue to abstain from triggers for tachycardia such as alcohol, caffeine, and stress. -She was advised to contact us if she has increased episodes of tachycardia in the future.  2.  Essential hypertension: -HYPERTENSION CONTROL Vitals:   05/15/23 1024 05/15/23 1107  BP: (!) 144/98 (!) 152/98    [name] blood pressure is elevated above target today.  In order to address the patient's elevated BP: Blood pressure will be monitored at home to determine if medication changes need to be made.; A current anti-hypertensive medication was adjusted today.    -Patient had recent change to BP regimen  with addition of losartan 50 mg -Continue HCTZ 25 mg daily and patient was advised to monitor blood pressure over next 2 weeks with expectation to increase BP medication if blood pressures are still above goal of 130/80. -Patient advised to follow Dash type diet as well as Mediterranean   3.  Morbid obesity: -Patient's BMI is 45.26 kg/m -Patient advised to increase physical activity as tolerated  4.  DM type II: -Patient's hemoglobin A1c was 6.9 -Continue current treatment plan per PCP  5.  Hyperlipidemia: -Patient's LDL cholesterol was 105 -Currently followed by PCP  Disposition: Follow-up with Lance Muss, MD or APP in 6 months   Medication Adjustments/Labs and Tests Ordered: Current medicines are reviewed at length with the patient today.  Concerns regarding medicines are outlined above.   Signed, Napoleon Form, Leodis Rains, NP 05/15/2023, 11:08 AM [facility]

## 2023-05-15 ENCOUNTER — Ambulatory Visit: Payer: Commercial Managed Care - HMO | Attending: Nurse Practitioner | Admitting: Nurse Practitioner

## 2023-05-15 ENCOUNTER — Encounter: Payer: Self-pay | Admitting: Nurse Practitioner

## 2023-05-15 ENCOUNTER — Telehealth: Payer: Self-pay | Admitting: *Deleted

## 2023-05-15 VITALS — BP 152/98 | HR 75 | Ht 65.0 in | Wt 272.0 lb

## 2023-05-15 DIAGNOSIS — I471 Supraventricular tachycardia, unspecified: Secondary | ICD-10-CM

## 2023-05-15 DIAGNOSIS — E1159 Type 2 diabetes mellitus with other circulatory complications: Secondary | ICD-10-CM | POA: Diagnosis not present

## 2023-05-15 DIAGNOSIS — E785 Hyperlipidemia, unspecified: Secondary | ICD-10-CM

## 2023-05-15 DIAGNOSIS — E1169 Type 2 diabetes mellitus with other specified complication: Secondary | ICD-10-CM | POA: Diagnosis not present

## 2023-05-15 DIAGNOSIS — R400 Somnolence: Secondary | ICD-10-CM

## 2023-05-15 DIAGNOSIS — Z7984 Long term (current) use of oral hypoglycemic drugs: Secondary | ICD-10-CM

## 2023-05-15 DIAGNOSIS — I152 Hypertension secondary to endocrine disorders: Secondary | ICD-10-CM

## 2023-05-15 DIAGNOSIS — E139 Other specified diabetes mellitus without complications: Secondary | ICD-10-CM

## 2023-05-15 MED ORDER — BLOOD PRESSURE CUFF MISC: 1.0000 | Freq: Every day | 0 refills | Status: AC

## 2023-05-15 NOTE — Patient Instructions (Addendum)
Medication Instructions:  START LOSARTAN 50mg  Take 1 tablet once a day  *If you need a refill on your cardiac medications before your next appointment, please call your pharmacy*   Lab Work: NONE ORDERED   Testing/Procedures: Donnie Coffin SLEEP STUDY   Follow-Up: At [facility], you and your health needs are our priority.  As part of our continuing mission to provide you with exceptional heart care, we have created designated Provider Care Teams.  These Care Teams include your primary Cardiologist (physician) and Advanced Practice Providers (APPs -  Physician Assistants and Nurse Practitioners) who all work together to provide you with the care you need, when you need it.  We recommend signing up for the patient portal called "MyChart".  Sign up information is provided on this After Visit Summary.  MyChart is used to connect with patients for Virtual Visits (Telemedicine).  Patients are able to view lab/test results, encounter notes, upcoming appointments, etc.  Non-urgent messages can be sent to your provider as well.   To learn more about what you can do with MyChart, go to [email or web address].    Your next appointment:   6 month(s)  Provider:   Lance Muss, MD  or Robin Searing, NP   Other Instructions Check your blood pressure daily for 2 weeks, then contact the office with your readings.  Make sure to check 2 hours after your medications.   AVOID these things for 30 minutes before checking your blood pressure: No Drinking caffeine. No Drinking alcohol. No Eating. No Smoking. No Exercising.  Five minutes before checking your blood pressure: Pee. Sit in a dining chair. Avoid sitting in a soft couch or armchair. Be quiet. Do not talk.

## 2023-05-15 NOTE — Telephone Encounter (Signed)
Pt has been ordered by a Coletta Memos a Itamar sleep study. Pt aware to not open the box until she has been called the PIN#.

## 2023-05-19 NOTE — Telephone Encounter (Signed)
CORRECTION, ITAMAR STUDY WAS ORDERED BY Robin Searing, NP ON 05/15/23

## 2023-05-20 NOTE — Addendum Note (Signed)
Addended by: Garnette Gunner on: 05/20/2023 03:41 PM   Modules accepted: Orders

## 2023-05-21 ENCOUNTER — Other Ambulatory Visit: Payer: Commercial Managed Care - HMO

## 2023-05-26 NOTE — Telephone Encounter (Signed)
Prior Authorization for [facility] sent to [employer] via web portal. Tracking Number . NO PA REQ - CONFIRMATION#  [id number]

## 2023-05-27 NOTE — Telephone Encounter (Signed)
**Note De-Identified Ashley Weaver Obfuscation** I called the pt and provided her with the PIN # so she can proceed with her Itamar-HST study. She verbalized understanding and thanked me for my call.

## 2023-06-11 ENCOUNTER — Ambulatory Visit
Admission: RE | Admit: 2023-06-11 | Discharge: 2023-06-11 | Disposition: A | Discharge status: Home / Self Care | Payer: Commercial Managed Care - HMO | Source: Ambulatory Visit | Attending: Family Medicine | Admitting: Family Medicine

## 2023-06-11 DIAGNOSIS — G8929 Other chronic pain: Secondary | ICD-10-CM

## 2023-08-12 ENCOUNTER — Telehealth: Payer: Self-pay | Admitting: Family Medicine

## 2023-08-12 ENCOUNTER — Ambulatory Visit: Payer: Managed Care, Other (non HMO) | Admitting: Family Medicine

## 2023-08-12 ENCOUNTER — Encounter: Payer: Self-pay | Admitting: Family Medicine

## 2023-08-12 VITALS — BP 136/86 | HR 90 | Temp 97.6°F | Wt 271.4 lb

## 2023-08-12 DIAGNOSIS — Z1211 Encounter for screening for malignant neoplasm of colon: Secondary | ICD-10-CM

## 2023-08-12 DIAGNOSIS — Z6841 Body Mass Index (BMI) 40.0 and over, adult: Secondary | ICD-10-CM

## 2023-08-12 DIAGNOSIS — Z1159 Encounter for screening for other viral diseases: Secondary | ICD-10-CM

## 2023-08-12 DIAGNOSIS — R42 Dizziness and giddiness: Secondary | ICD-10-CM | POA: Diagnosis not present

## 2023-08-12 DIAGNOSIS — I152 Hypertension secondary to endocrine disorders: Secondary | ICD-10-CM | POA: Diagnosis not present

## 2023-08-12 DIAGNOSIS — E139 Other specified diabetes mellitus without complications: Secondary | ICD-10-CM

## 2023-08-12 DIAGNOSIS — Z23 Encounter for immunization: Secondary | ICD-10-CM

## 2023-08-12 DIAGNOSIS — E66813 Obesity, class 3: Secondary | ICD-10-CM

## 2023-08-12 DIAGNOSIS — E785 Hyperlipidemia, unspecified: Secondary | ICD-10-CM | POA: Diagnosis not present

## 2023-08-12 DIAGNOSIS — R0789 Other chest pain: Secondary | ICD-10-CM

## 2023-08-12 DIAGNOSIS — Z7985 Long-term (current) use of injectable non-insulin antidiabetic drugs: Secondary | ICD-10-CM | POA: Diagnosis not present

## 2023-08-12 DIAGNOSIS — R3121 Asymptomatic microscopic hematuria: Secondary | ICD-10-CM

## 2023-08-12 DIAGNOSIS — E1169 Type 2 diabetes mellitus with other specified complication: Secondary | ICD-10-CM | POA: Diagnosis not present

## 2023-08-12 DIAGNOSIS — G8929 Other chronic pain: Secondary | ICD-10-CM

## 2023-08-12 DIAGNOSIS — E1159 Type 2 diabetes mellitus with other circulatory complications: Secondary | ICD-10-CM

## 2023-08-12 LAB — CBC WITH DIFFERENTIAL/PLATELET
Basophils Absolute: 0 10*3/uL (ref 0.0–0.1)
Basophils Relative: 0.3 % (ref 0.0–3.0)
Eosinophils Absolute: 0.1 10*3/uL (ref 0.0–0.7)
Eosinophils Relative: 2.9 % (ref 0.0–5.0)
HCT: 39.5 % (ref 36.0–46.0)
Hemoglobin: 12.5 g/dL (ref 12.0–15.0)
Lymphocytes Relative: 32.3 % (ref 12.0–46.0)
Lymphs Abs: 1.3 10*3/uL (ref 0.7–4.0)
MCHC: 31.5 g/dL (ref 30.0–36.0)
MCV: 86 fL (ref 78.0–100.0)
Monocytes Absolute: 0.3 10*3/uL (ref 0.1–1.0)
Monocytes Relative: 6.6 % (ref 3.0–12.0)
Neutro Abs: 2.3 10*3/uL (ref 1.4–7.7)
Neutrophils Relative %: 57.9 % (ref 43.0–77.0)
Platelets: 278 10*3/uL (ref 150.0–400.0)
RBC: 4.6 Mil/uL (ref 3.87–5.11)
RDW: 13.9 % (ref 11.5–15.5)
WBC: 4 10*3/uL (ref 4.0–10.5)

## 2023-08-12 LAB — LIPID PANEL
Cholesterol: 183 mg/dL (ref 0–200)
HDL: 45.2 mg/dL (ref >39.00)
LDL Cholesterol: 124 mg/dL — ABNORMAL HIGH (ref 0–99)
NonHDL: 138.15
Total CHOL/HDL Ratio: 4
Triglycerides: 70 mg/dL (ref 0.0–149.0)
VLDL: 14 mg/dL (ref 0.0–40.0)

## 2023-08-12 LAB — COMPREHENSIVE METABOLIC PANEL
ALT: 7 U/L (ref 0–35)
AST: 12 U/L (ref 0–37)
Albumin: 4.1 g/dL (ref 3.5–5.2)
Alkaline Phosphatase: 72 U/L (ref 39–117)
BUN: 8 mg/dL (ref 6–23)
CO2: 26 meq/L (ref 19–32)
Calcium: 8.9 mg/dL (ref 8.4–10.5)
Chloride: 103 meq/L (ref 96–112)
Creatinine, Ser: 0.71 mg/dL (ref 0.40–1.20)
GFR: 101.48 mL/min (ref >60.00)
Glucose, Bld: 145 mg/dL — ABNORMAL HIGH (ref 70–99)
Potassium: 4.1 meq/L (ref 3.5–5.1)
Sodium: 137 meq/L (ref 135–145)
Total Bilirubin: 0.5 mg/dL (ref 0.2–1.2)
Total Protein: 7.2 g/dL (ref 6.0–8.3)

## 2023-08-12 LAB — URINALYSIS, ROUTINE W REFLEX MICROSCOPIC
Bilirubin Urine: NEGATIVE
Ketones, ur: NEGATIVE
Leukocytes,Ua: NEGATIVE
Nitrite: NEGATIVE
Specific Gravity, Urine: 1.02 (ref 1.000–1.030)
Total Protein, Urine: 30 — AB
Urine Glucose: NEGATIVE
Urobilinogen, UA: 0.2 (ref 0.0–1.0)
pH: 7 (ref 5.0–8.0)

## 2023-08-12 LAB — TSH: TSH: 0.93 u[IU]/mL (ref 0.35–5.50)

## 2023-08-12 LAB — MICROALBUMIN / CREATININE URINE RATIO
Creatinine,U: 106.9 mg/dL
Microalb Creat Ratio: 9.5 mg/g (ref 0.0–30.0)
Microalb, Ur: 10.2 mg/dL — ABNORMAL HIGH (ref 0.0–1.9)

## 2023-08-12 LAB — POCT GLYCOSYLATED HEMOGLOBIN (HGB A1C): Hemoglobin A1C: 7.3 % — AB (ref 4.0–5.6)

## 2023-08-12 MED ORDER — TIRZEPATIDE 2.5 MG/0.5ML ~~LOC~~ SOAJ: 2.5000 mg | SUBCUTANEOUS | 0 refills | Status: DC

## 2023-08-12 MED ORDER — OLMESARTAN-AMLODIPINE-HCTZ 40-10-25 MG PO TABS: 1.0000 | ORAL_TABLET | Freq: Every day | ORAL | 0 refills | Status: DC

## 2023-08-12 NOTE — Progress Notes (Signed)
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 Initiate/continue tirzepatide for glycemic control.       Relevant Medications   tirzepatide (MOUNJARO) 2.5 MG/0.5ML Pen   Olmesartan-amLODIPine-HCTZ 40-10-25 MG TABS   rosuvastatin (CRESTOR) 10 MG tablet   Other Relevant Orders   POCT glycosylated hemoglobin (Hb A1C) (Completed)   Urinalysis, Routine w reflex microscopic (Completed)   Microalbumin / creatinine urine ratio (Completed)   Hyperlipidemia associated with type 2 diabetes mellitus (HCC)    Continue rosuvastatin for cholesterol management. Lipid panel to be monitored.      Relevant Medications   tirzepatide (MOUNJARO) 2.5 MG/0.5ML Pen   Olmesartan-amLODIPine-HCTZ 40-10-25 MG TABS   rosuvastatin (CRESTOR) 10 MG tablet   Other Relevant Orders   Lipid panel (Completed)     Genitourinary   Asymptomatic microscopic hematuria    Urinalysis with microscopic + reflex culture to explore underlying causes.      Relevant Orders   Urinalysis w microscopic + reflex cultur     Other   Chronic chest wall pain    Referral to Physical Therapy and Sports Medicine. Prescription of meloxicam as needed, though effectiveness was previously limited. Limit activities that exacerbate the condition.      Relevant Orders   Ambulatory referral to Sports Medicine   Ambulatory referral to Physical Therapy   Class  3 severe obesity due to excess calories with serious comorbidity and body mass index (BMI) of 45.0 to 49.9 in adult [facility])    Monitor weight on tirzepatide      Relevant Medications   tirzepatide (MOUNJARO) 2.5 MG/0.5ML Pen   Vertigo    Recommend vestibular rehabilitation through PT. CBC with Differential/Platelet to rule out other causes completed.      Relevant Orders   PT vestibular rehab   CBC with Differential/Platelet (Completed)   Other Visit Diagnoses     Immunization due       Relevant Orders   Flu vaccine trivalent PF, 6mos and older(Flulaval,Afluria,Fluarix,Fluzone) (Completed)   Screening for viral disease       Relevant Orders   HIV antibody (with reflex) (Completed)   Screening for colon cancer       Relevant Orders   Ambulatory referral to Gastroenterology       Medications Discontinued During This Encounter  Medication Reason   metFORMIN (GLUCOPHAGE) 1000 MG tablet    hydrochlorothiazide (HYDRODIURIL) 25 MG tablet    losartan (COZAAR) 50 MG tablet    rosuvastatin (CRESTOR) 5 MG tablet     Return in about 3 months (around 11/12/2023) for DM, BP, HLD.    Subjective:   Encounter date: 08/12/2023  Ashley Weaver is a 47 y.o. female who has Diabetes 1.5, managed as type 2 (HCC); Hypertension associated with diabetes (HCC); Morbid obesity (HCC); SVT (supraventricular tachycardia) (HCC); Excessive bleeding in premenopausal period; Hyperlipidemia associated with type 2 diabetes mellitus (HCC); Gastroesophageal reflux disease; Chronic chest wall pain; Asymptomatic  microscopic hematuria; Class 3 severe obesity due to excess calories with serious comorbidity and body mass index (BMI) of 45.0 to 49.9 in adult [facility]); and Vertigo on their problem list..   She  has a past medical history of Allergy (09/2022), Diabetes mellitus without complication (HCC), GERD (gastroesophageal reflux disease) (01/2019), Hyperlipidemia associated with type 2 diabetes mellitus (HCC)  (05/12/2023), Hypertension, and SVT (supraventricular tachycardia) (HCC)..   Subjective:  Chief Complaint: Follow-up for diabetes and blood pressure management and medication adjustments; evaluation of musculoskeletal chest pain.  History of Present Illness:  Diabetes: Recent HbA1c reading of 7.3 indicates a rise from 6.9. Weight has increased marginally. The patient ceased Metformin due to nausea and headaches.   Musculoskeletal Chest Pain: Pain under breasts during lifting or bending. Previous evaluation by cardiology has ruled out cardiac problems. Current management with meloxicam and lidocaine has been suboptimal.  Lab Results  Component Value Date   CHOL 183 08/12/2023   HDL 45.20 08/12/2023   LDLCALC 124 (H) 08/12/2023   TRIG 70.0 08/12/2023   CHOLHDL 4 08/12/2023    Review of Systems  Constitutional:  Negative for chills, diaphoresis, fever, malaise/fatigue and weight loss.  HENT:  Negative for congestion, ear discharge, ear pain and hearing loss.   Eyes:  Negative for blurred vision, double vision, photophobia, pain, discharge and redness.  Respiratory:  Negative for cough, sputum production, shortness of breath and wheezing.   Cardiovascular:  Positive for chest pain. Negative for palpitations and leg swelling.  Gastrointestinal:  Positive for nausea (From metformin). Negative for abdominal pain, blood in stool, constipation, diarrhea, heartburn, melena and vomiting.  Genitourinary:  Negative for dysuria, flank pain, frequency, hematuria and urgency.  Musculoskeletal:  Negative for myalgias.  Skin:  Negative for itching and rash.  Neurological:  Positive for dizziness (When rolling over in bed) and headaches. Negative for tingling, tremors, speech change, seizures, loss of consciousness and weakness.  Psychiatric/Behavioral:  Negative for depression, hallucinations, memory loss, substance abuse and suicidal ideas. The patient does not have insomnia.   All other systems  reviewed and are negative.   Past Surgical History:  Procedure Laterality Date   CHOLECYSTECTOMY     TUBAL LIGATION  03/26/2005    Outpatient Medications Prior to Visit  Medication Sig Dispense Refill   lidocaine (LIDODERM) 5 % Place 1 patch onto the skin daily. Remove & Discard patch within 12 hours or as directed by MD 30 patch 0   meloxicam (MOBIC) 7.5 MG tablet Take 1 tablet (7.5 mg total) by mouth daily. 30 tablet 0   pantoprazole (PROTONIX) 40 MG tablet Take 1 tablet (40 mg total) by mouth daily. 90 tablet 3   hydrochlorothiazide (HYDRODIURIL) 25 MG tablet Take 1 tablet (25 mg total) by mouth daily. 90 tablet 3   losartan (COZAAR) 50 MG tablet Take 1 tablet (50 mg total) by mouth daily. 90 tablet 3   metFORMIN (GLUCOPHAGE) 1000 MG tablet Take 1 tablet (1,000 mg total) by mouth daily with breakfast. 90 tablet 3   rosuvastatin (CRESTOR) 5 MG tablet Take 1 tablet (5 mg total) by mouth daily. 90 tablet 3   Blood Pressure Monitoring (BLOOD PRESSURE CUFF) MISC 1 each by Does not apply route daily. (Patient not taking: Reported on 08/12/2023) 1 each 0   No facility-administered medications prior to visit.    Family History  Problem Relation Age of Onset   Diabetes Father    Stroke Mother    Cancer Maternal Grandfather    Varicose Veins  Maternal Grandmother    Cancer Paternal Grandfather    Diabetes Paternal Grandmother     Social History   Socioeconomic History   Marital status: Married    Spouse name: Not on file   Number of children: Not on file   Years of education: Not on file   Highest education level: Associate degree: academic program  Occupational History   Not on file  Tobacco Use   Smoking status: Former    Types: Cigarettes    Passive exposure: Never   Smokeless tobacco: Never  Vaping Use   Vaping status: Not on file  Substance and Sexual Activity   Alcohol use: Not Currently   Drug use: No   Sexual activity: Yes    Birth control/protection: None  Other  Topics Concern   Not on file  Social History Narrative   Not on file   Social Determinants of Health   Financial Resource Strain: Low Risk  (05/12/2023)   Overall Financial Resource Strain (CARDIA)    Difficulty of Paying Living Expenses: Not very hard  Food Insecurity: Food Insecurity Present (05/12/2023)   Hunger Vital Sign    Worried About Running Out of Food in the Last Year: Sometimes true    Ran Out of Food in the Last Year: Sometimes true  Transportation Needs: No Transportation Needs (05/12/2023)   PRAPARE - Administrator, Civil Service (Medical): No    Lack of Transportation (Non-Medical): No  Physical Activity: Insufficiently Active (05/12/2023)   Exercise Vital Sign    Days of Exercise per Week: 7 days    Minutes of Exercise per Session: 20 min  Stress: Stress Concern Present (05/12/2023)   [employer] of Occupational Health - Occupational Stress Questionnaire    Feeling of Stress : To some extent  Social Connections: Unknown (05/12/2023)   Social Connection and Isolation Panel [NHANES]    Frequency of Communication with Friends and Family: Patient declined    Frequency of Social Gatherings with Friends and Family: Patient declined    Attends Religious Services: More than 4 times per year    Active Member of [employer] or Organizations: No    Attends Banker Meetings: Never    Marital Status: Married  Catering manager Violence: Unknown (02/12/2022)   Received from [employer], [facility]   HITS    Physically Hurt: Not on file    Insult or Talk Down To: Not on file    Threaten Physical Harm: Not on file    Scream or Curse: Not on file                                                                                                  Objective:  Physical Exam: BP 136/86 (BP Location: Left Arm, Patient Position: Sitting, Cuff Size: Large)   Pulse 90   Temp 97.6 F (36.4 C) (Temporal)   Wt 271 lb 6.4 oz (123.1 kg)   LMP 08/11/2023   SpO2 100%    BMI 45.16 kg/m   Wt Readings from Last 3 Encounters:  08/12/23 271 lb 6.4 oz (  123.1 kg)  05/15/23 272 lb (123.4 kg)  05/12/23 269 lb 9.6 oz (122.3 kg)     Physical Exam  CT CHEST WO CONTRAST  Result Date: 06/17/2023 CLINICAL DATA:  Trauma EXAM: CT CHEST WITHOUT CONTRAST TECHNIQUE: Multidetector CT imaging of the chest was performed following the standard protocol without IV contrast. RADIATION DOSE REDUCTION: This exam was performed according to the departmental dose-optimization program which includes automated exposure control, adjustment of the mA and/or kV according to patient size and/or use of iterative reconstruction technique. COMPARISON:  09/06/2011 FINDINGS: Cardiovascular: No significant vascular findings. Normal heart size. No pericardial effusion. Mediastinum/Nodes: Stable mildly prominent bilateral axillary nodes which are symmetric and unchanged from 2012. No suspicious adenopathy. Thyroid gland, trachea, and esophagus demonstrate no significant findings. Lungs/Pleura: Lungs are clear. No pleural effusion or pneumothorax. Upper Abdomen: No acute abnormality. Musculoskeletal: No fracture is seen. No chest wall abnormalities. There are thoracic degenerative changes. IMPRESSION: No acute cardiopulmonary process and no traumatic abnormalities. Electronically Signed   By: Layla Maw M.D.   On: 06/17/2023 17:02    Recent Results (from the past 2160 hour(s))  POCT glycosylated hemoglobin (Hb A1C)     Status: Abnormal   Collection Time: 08/12/23  9:40 AM  Result Value Ref Range   Hemoglobin A1C 7.3 (A) 4.0 - 5.6 %   HbA1c POC (<> result, manual entry)     HbA1c, POC (prediabetic range)     HbA1c, POC (controlled diabetic range)    TSH     Status: None   Collection Time: 08/12/23  9:52 AM  Result Value Ref Range   TSH 0.93 0.35 - 5.50 uIU/mL  Lipid panel     Status: Abnormal   Collection Time: 08/12/23  9:52 AM  Result Value Ref Range   Cholesterol 183 0 - 200 mg/dL     Comment: ATP III Classification       Desirable:  < 200 mg/dL               Borderline High:  200 - 239 mg/dL          High:  > = 161 mg/dL   Triglycerides 09.6 0.0 - 149.0 mg/dL    Comment: Normal:  <045 mg/dLBorderline High:  150 - 199 mg/dL   HDL 40.98 >11.91 mg/dL   VLDL 47.8 0.0 - 29.5 mg/dL   LDL Cholesterol 621 (H) 0 - 99 mg/dL   Total CHOL/HDL Ratio 4     Comment:                Men          Women1/2 Average Risk     3.4          3.3Average Risk          5.0          4.42X Average Risk          9.6          7.13X Average Risk          15.0          11.0                       NonHDL 138.15     Comment: NOTE:  Non-HDL goal should be 30 mg/dL higher than patient's LDL goal (i.e. LDL goal of < 70 mg/dL, would have non-HDL goal of < 100 mg/dL)  Urinalysis, Routine w reflex microscopic  Status: Abnormal   Collection Time: 08/12/23  9:52 AM  Result Value Ref Range   Color, Urine Red (A) Yellow;Lt. Yellow;Straw;Dark Yellow;Amber;Green;Red;Brown   APPearance Cloudy (A) Clear;Turbid;Slightly Cloudy;Cloudy   Specific Gravity, Urine 1.020 1.000 - 1.030   pH 7.0 5.0 - 8.0   Total Protein, Urine 30 (A) Negative   Urine Glucose NEGATIVE Negative   Ketones, ur NEGATIVE Negative   Bilirubin Urine NEGATIVE Negative   Hgb urine dipstick LARGE (A) Negative   Urobilinogen, UA 0.2 0.0 - 1.0   Leukocytes,Ua NEGATIVE Negative   Nitrite NEGATIVE Negative   WBC, UA 0-2/hpf 0-2/hpf   RBC / HPF TNTC(>50/hpf) (A) 0-2/hpf   Squamous Epithelial / HPF Rare(0-4/hpf) Rare(0-4/hpf)  CBC with Differential/Platelet     Status: None   Collection Time: 08/12/23  9:52 AM  Result Value Ref Range   WBC 4.0 4.0 - 10.5 K/uL   RBC 4.60 3.87 - 5.11 Mil/uL   Hemoglobin 12.5 12.0 - 15.0 g/dL   HCT 72.5 36.6 - 44.0 %   MCV 86.0 78.0 - 100.0 fl   MCHC 31.5 30.0 - 36.0 g/dL   RDW 34.7 42.5 - 95.6 %   Platelets 278.0 150.0 - 400.0 K/uL   Neutrophils Relative % 57.9 43.0 - 77.0 %   Lymphocytes Relative 32.3 12.0 -  46.0 %   Monocytes Relative 6.6 3.0 - 12.0 %   Eosinophils Relative 2.9 0.0 - 5.0 %   Basophils Relative 0.3 0.0 - 3.0 %   Neutro Abs 2.3 1.4 - 7.7 K/uL   Lymphs Abs 1.3 0.7 - 4.0 K/uL   Monocytes Absolute 0.3 0.1 - 1.0 K/uL   Eosinophils Absolute 0.1 0.0 - 0.7 K/uL   Basophils Absolute 0.0 0.0 - 0.1 K/uL  Comprehensive metabolic panel     Status: Abnormal   Collection Time: 08/12/23  9:52 AM  Result Value Ref Range   Sodium 137 135 - 145 mEq/L   Potassium 4.1 3.5 - 5.1 mEq/L   Chloride 103 96 - 112 mEq/L   CO2 26 19 - 32 mEq/L   Glucose, Bld 145 (H) 70 - 99 mg/dL   BUN 8 6 - 23 mg/dL   Creatinine, Ser 3.87 0.40 - 1.20 mg/dL   Total Bilirubin 0.5 0.2 - 1.2 mg/dL   Alkaline Phosphatase 72 39 - 117 U/L   AST 12 0 - 37 U/L   ALT 7 0 - 35 U/L   Total Protein 7.2 6.0 - 8.3 g/dL   Albumin 4.1 3.5 - 5.2 g/dL   GFR 564.33 >29.51 mL/min    Comment: Calculated using the CKD-EPI Creatinine Equation (2021)   Calcium 8.9 8.4 - 10.5 mg/dL  Microalbumin / creatinine urine ratio     Status: Abnormal   Collection Time: 08/12/23  9:52 AM  Result Value Ref Range   Microalb, Ur 10.2 (H) 0.0 - 1.9 mg/dL   Creatinine,U 884.1 mg/dL   Microalb Creat Ratio 9.5 0.0 - 30.0 mg/g  HIV antibody (with reflex)     Status: None   Collection Time: 08/12/23  9:52 AM  Result Value Ref Range   HIV 1&2 Ab, 4th Generation NON-REACTIVE NON-REACTIVE    Comment: HIV-1 antigen and HIV-1/HIV-2 antibodies were not detected. There is no laboratory evidence of HIV infection. Marland Kitchen PLEASE NOTE: This information has been disclosed to you from records whose confidentiality may be protected by state law.  If your state requires such protection, then the state law prohibits you from making any further disclosure of  the information without the specific written consent of the person to whom it pertains, or as otherwise permitted by law. A general authorization for the release of medical or other information is NOT  sufficient for this purpose. . For additional information please refer to http://education.questdiagnostics.com/faq/FAQ106 (This link is being provided for informational/ educational purposes only.) . Marland Kitchen The performance of this assay has not been clinically validated in patients less than 75 years old. Garner Nash, MD, MS

## 2023-08-12 NOTE — Telephone Encounter (Signed)
Received request from pharmacy requesting PA for [facility]. Please advise.

## 2023-08-12 NOTE — Patient Instructions (Addendum)
-   Continue monitoring your blood glucose levels daily. Consider we are switching from metformin to tirzepatide injections as discussed. - Schedule and attend a physical therapy session for chest and vestibular rehabilitation. Follow up with sports medicine.  - Get your lab work done today to check your cholesterol and metabolic panel. - Review and manage your blood pressure with the new combined medication containing olmesartan, amlodipine, and hydrochlorothiazide. Stop losartan and hydrochlorthiazide - Schedule a colonoscopy for colon cancer screening, especially given your family history.

## 2023-08-12 NOTE — Telephone Encounter (Signed)
Med management  tirzepatide [facility] 2.5 MG/0.5ML Pen [[id number]]   Pharmacy needs PA to fill this script or another option.  [facility] [address] [place], [place] - [address] [place], [place] [address] Phone: [phone]  Fax: [phone]

## 2023-08-13 ENCOUNTER — Other Ambulatory Visit (HOSPITAL_COMMUNITY): Payer: Self-pay

## 2023-08-13 ENCOUNTER — Telehealth: Payer: Self-pay

## 2023-08-13 LAB — HIV ANTIBODY (ROUTINE TESTING W REFLEX): HIV 1&2 Ab, 4th Generation: NONREACTIVE

## 2023-08-13 MED ORDER — ROSUVASTATIN CALCIUM 10 MG PO TABS
10.0000 mg | ORAL_TABLET | Freq: Every day | ORAL | 3 refills | Status: DC
Start: 2023-08-13 — End: 2024-02-05

## 2023-08-13 NOTE — Telephone Encounter (Signed)
Pharmacy Patient Advocate Encounter   Received notification from Pt Calls Messages that prior authorization for Ashley Weaver is required/requested.   Insurance verification completed.   The patient is insured through [employer] .   Per test claim: PA required; PA submitted to [employer] via Fax Key/confirmation #/EOC [id number] Status is pending   Phone # [phone] Fax# [phone]

## 2023-08-14 DIAGNOSIS — R3121 Asymptomatic microscopic hematuria: Secondary | ICD-10-CM | POA: Insufficient documentation

## 2023-08-14 DIAGNOSIS — R42 Dizziness and giddiness: Secondary | ICD-10-CM | POA: Insufficient documentation

## 2023-08-14 NOTE — Assessment & Plan Note (Addendum)
Change blood pressure medications to improve control with combination therapy (olmesartan/amlodipine/hydrochlorothiazide)

## 2023-08-14 NOTE — Assessment & Plan Note (Addendum)
Discontinue Metformin due to adverse effects. Initiate/continue tirzepatide for glycemic control.

## 2023-08-14 NOTE — Assessment & Plan Note (Signed)
Referral to Physical Therapy and Sports Medicine. Prescription of meloxicam as needed, though effectiveness was previously limited. Limit activities that exacerbate the condition.

## 2023-08-14 NOTE — Assessment & Plan Note (Signed)
Continue rosuvastatin for cholesterol management. Lipid panel to be monitored.

## 2023-08-14 NOTE — Assessment & Plan Note (Signed)
Recommend vestibular rehabilitation through PT. CBC with Differential/Platelet to rule out other causes completed.

## 2023-08-14 NOTE — Assessment & Plan Note (Signed)
Urinalysis with microscopic + reflex culture to explore underlying causes.

## 2023-08-14 NOTE — Assessment & Plan Note (Signed)
Monitor weight on tirzepatide

## 2023-08-19 NOTE — Progress Notes (Deleted)
   Rubin Payor, PhD, LAT, ATC acting as a scribe for [name], MD.  Ashley Weaver is a 47 y.o. female who presents to [facility] today for chest wall pain. Pt was previously seen by Dr. [name] on4/4  x ***. She works as a Merchandiser, retail at [employer]. Pt has been seen by cardiology for SVT and was seen at the ED on 01/12/23. Pt locates pain to ***  Radiates: Aggravates: Treatments tried: meloxicam  Dx testing: 06/11/23 Chest CT  Labs  03/12/23 Echocardiogram  02/12/23 & 01/13/23 EKG  01/12/23 Chest XR  Pertinent review of systems: ***  Relevant historical information: ***   Exam:  LMP 08/11/2023  General: Well Developed, well nourished, and in no acute distress.   MSK: ***    Lab and Radiology Results No results found for this or any previous visit (from the past 72 hour(s)). No results found.     Assessment and Plan: 47 y.o. female with ***   PDMP not reviewed this encounter. No orders of the defined types were placed in this encounter.  No orders of the defined types were placed in this encounter.    Discussed warning signs or symptoms. Please see discharge instructions. Patient expresses understanding.   ***

## 2023-08-20 ENCOUNTER — Telehealth: Payer: Self-pay

## 2023-08-20 ENCOUNTER — Ambulatory Visit: Payer: Commercial Managed Care - HMO | Admitting: Family Medicine

## 2023-08-20 DIAGNOSIS — E139 Other specified diabetes mellitus without complications: Secondary | ICD-10-CM

## 2023-08-20 MED ORDER — TRULICITY 0.75 MG/0.5ML ~~LOC~~ SOPN
0.7500 mg | PEN_INJECTOR | SUBCUTANEOUS | 0 refills | Status: AC
Start: 2023-08-20 — End: 2023-11-12

## 2023-08-20 NOTE — Telephone Encounter (Signed)
Faxed additional information for [facility] prior authorization to [phone]

## 2023-08-20 NOTE — Addendum Note (Signed)
Addended by: Fanny Bien B on: 08/20/2023 05:13 PM   Modules accepted: Orders

## 2023-08-20 NOTE — Telephone Encounter (Signed)
Received fax from [facility] pharmacy stating that Greggory Keen is not on formulary. Preferred products are Trulicity or Byetta. Please advise.

## 2023-08-21 NOTE — Telephone Encounter (Signed)
Left patient a detailed voice message with annotation below and to return call to office with any concerns  

## 2023-08-26 NOTE — Therapy (Deleted)
OUTPATIENT PHYSICAL THERAPY VESTIBULAR EVALUATION     Patient Name: Ashley Weaver MRN: [id number] DOB:27-May-1976, 47 y.o., female Today's Date: 08/26/2023  END OF SESSION:   Past Medical History:  Diagnosis Date   Allergy 09/2022   Diabetes mellitus without complication (HCC)    GERD (gastroesophageal reflux disease) 01/2019   Hyperlipidemia associated with type 2 diabetes mellitus (HCC) 05/12/2023   Hypertension    SVT (supraventricular tachycardia) (HCC)    Past Surgical History:  Procedure Laterality Date   CHOLECYSTECTOMY     TUBAL LIGATION  03/26/2005   Patient Active Problem List   Diagnosis Date Noted   Asymptomatic microscopic hematuria 08/14/2023   Class 3 severe obesity due to excess calories with serious comorbidity and body mass index (BMI) of 45.0 to 49.9 in adult [facility]) 08/14/2023   Vertigo 08/14/2023   Excessive bleeding in premenopausal period 05/12/2023   Hyperlipidemia associated with type 2 diabetes mellitus (HCC) 05/12/2023   Gastroesophageal reflux disease 05/12/2023   Chronic chest wall pain 05/12/2023   SVT (supraventricular tachycardia) (HCC) 01/22/2023   Diabetes 1.5, 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   Morbid obesity (HCC) 03/08/2019   Hypertension associated with diabetes (HCC) 11/22/2017    PCP: Garnette Gunner, MD REFERRING PROVIDER: Garnette Gunner, MD  REFERRING DIAG: [phone] (ICD-10-CM) - Chronic chest wall pain   THERAPY DIAG:  No diagnosis found.  ONSET DATE: 08/12/23  Rationale for Evaluation and Treatment: Rehabilitation  SUBJECTIVE:   SUBJECTIVE STATEMENT: *** Pt accompanied by: {accompnied:27141}  PERTINENT HISTORY: HTN, DM, class 3 obesity, hyperlipidemia Per referring physician note: Chronic chest wall pain       Referral to Physical Therapy and Sports Medicine. Prescription of meloxicam as needed, though effectiveness was previously limited. Limit activities that exacerbate the condition.     Vertigo        Recommend vestibular rehabilitation through PT. CBC with Differential/Platelet to rule out other causes completed.      PAIN:  Are you having pain? {OPRCPAIN:27236}  PRECAUTIONS: {Therapy precautions:24002}  RED FLAGS: None   WEIGHT BEARING RESTRICTIONS: Avoid activities which exacerbate chest wall pain  FALLS: Has patient fallen in last 6 months? {fallsyesno:27318}  LIVING ENVIRONMENT: Lives with: {OPRC lives with:25569::"lives with their family"} Lives in: {Lives in:25570} Stairs: {opstairs:27293} Has following equipment at home: {Assistive devices:23999}  PLOF: {PLOF:24004}  PATIENT GOALS: ***  OBJECTIVE:  Note: Objective measures were completed at Evaluation unless otherwise noted.  DIAGNOSTIC FINDINGS:  Chest CT 06/11/23 IMPRESSION: No acute cardiopulmonary process and no traumatic abnormalities.  COGNITION: Overall cognitive status: {cognition:24006}   SENSATION: {sensation:27233}  EDEMA:  {edema:24020}  MUSCLE TONE:  {LE tone:25568}  DTRs:  {DTR SITE:24025}  POSTURE:  {posture:25561}  Cervical ROM:    {AROM/PROM:27142} A/PROM (deg) eval  Flexion   Extension   Right lateral flexion   Left lateral flexion   Right rotation   Left rotation   (Blank rows = not tested)  STRENGTH: ***  LOWER EXTREMITY MMT:   MMT Right eval Left eval  Hip flexion    Hip abduction    Hip adduction    Hip internal rotation    Hip external rotation    Knee flexion    Knee extension    Ankle dorsiflexion    Ankle plantarflexion    Ankle inversion    Ankle eversion    (Blank rows = not tested)  BED MOBILITY:  {Bed mobility:24027}  TRANSFERS: Assistive device utilized: {Assistive devices:23999}  Sit to stand: {Levels of assistance:24026} Stand  to sit: {Levels of assistance:24026} Chair to chair: {Levels of assistance:24026} Floor: {Levels of assistance:24026}  RAMP: {Levels of assistance:24026}  CURB: {Levels of  assistance:24026}  GAIT: Gait pattern: {gait characteristics:25376} Distance walked: *** Assistive device utilized: {Assistive devices:23999} Level of assistance: {Levels of assistance:24026} Comments: ***  FUNCTIONAL TESTS:  {Functional tests:24029}  PATIENT SURVEYS:  {rehab surveys:24030}  VESTIBULAR ASSESSMENT:  GENERAL OBSERVATION: ***   SYMPTOM BEHAVIOR:  Subjective history: ***  Non-Vestibular symptoms: {nonvestibular symptoms:25260}  Type of dizziness: {Type of Dizziness:25255}  Frequency: ***  Duration: ***  Aggravating factors: {Aggravating Factors:25258}  Relieving factors: {Relieving Factors:25259}  Progression of symptoms: {DESC; BETTER/WORSE:18575}  OCULOMOTOR EXAM:  Ocular Alignment: {Ocular Alignment:25262}  Ocular ROM: {RANGE OF MOTION:21649}  Spontaneous Nystagmus: {Spontaneous nystagmus:25263}  Gaze-Induced Nystagmus: {gaze-induced nystagmus:25264}  Smooth Pursuits: {smooth pursuit:25265}  Saccades: {saccades:25266}  Convergence/Divergence: *** cm   VESTIBULAR - OCULAR REFLEX:   Slow VOR: {slow VOR:25290}  VOR Cancellation: {vor cancellation:25291}  Head-Impulse Test: {head impulse test:25272}  Dynamic Visual Acuity: {dynamic visual acuity:25273}   POSITIONAL TESTING: {Positional tests:25271}  MOTION SENSITIVITY:  Motion Sensitivity Quotient Intensity: 0 = none, 1 = Lightheaded, 2 = Mild, 3 = Moderate, 4 = Severe, 5 = Vomiting  Intensity  1. Sitting to supine   2. Supine to L side   3. Supine to R side   4. Supine to sitting   5. L Hallpike-Dix   6. Up from L    7. R Hallpike-Dix   8. Up from R    9. Sitting, head tipped to L knee   10. Head up from L knee   11. Sitting, head tipped to R knee   12. Head up from R knee   13. Sitting head turns x5   14.Sitting head nods x5   15. In stance, 180 turn to L    16. In stance, 180 turn to R     OTHOSTATICS: {Exam; orthostatics:31331}  FUNCTIONAL GAIT: {Functional  tests:24029}   VESTIBULAR TREATMENT:                                                                                                   DATE:  08/27/23  Canalith Repositioning:  {Canalith Repositioning:25283} Gaze Adaptation:  {gaze adaptation:25286} Habituation:  {habituation:25288} Other: ***  PATIENT EDUCATION: Education details: POC Person educated: Patient Education method: Explanation Education comprehension: verbalized understanding  HOME EXERCISE PROGRAM:  GOALS: Goals reviewed with patient? Yes  SHORT TERM GOALS: Target date: 09/12/23  I with initial HEP Baseline: Goal status: INITIAL  LONG TERM GOALS: Target date: ***  I with final HEP Baseline:  Goal status: INITIAL  2.  *** Baseline:  Goal status: {GOALSTATUS:25110}  3.  *** Baseline:  Goal status: {GOALSTATUS:25110}  4.  *** Baseline:  Goal status: {GOALSTATUS:25110}  5.  *** Baseline:  Goal status: {GOALSTATUS:25110}  6.  *** Baseline:  Goal status: {GOALSTATUS:25110}  ASSESSMENT:  CLINICAL IMPRESSION: Patient is a 47 y.o. who was seen today for physical therapy evaluation and treatment for chronic chest wall pain and vertigo***.   OBJECTIVE IMPAIRMENTS: Abnormal gait, decreased activity tolerance, decreased  balance, difficulty walking, decreased strength, dizziness, impaired flexibility, impaired vision/preception, postural dysfunction, and pain.   ACTIVITY LIMITATIONS: carrying, lifting, bending, transfers, bed mobility, and locomotion level  PARTICIPATION LIMITATIONS: meal prep, cleaning, laundry, driving, shopping, community activity, and occupation  PERSONAL FACTORS: Past/current experiences are also affecting patient's functional outcome.   REHAB POTENTIAL: Good  CLINICAL DECISION MAKING: Evolving/moderate complexity  EVALUATION COMPLEXITY: Moderate   PLAN:  PT FREQUENCY: 2x/week  PT DURATION: 10 weeks  PLANNED INTERVENTIONS: 97110-Therapeutic exercises, 97530-  Therapeutic activity, 97112- Neuromuscular re-education, 97535- Self Care, 30865- Manual therapy, [phone]- Gait training, 97014- Electrical stimulation (unattended), [phone]- Ionotophoresis 4mg /ml Dexamethasone, Patient/Family education, Balance training, Dry Needling, Joint mobilization, Spinal mobilization, Vestibular training, Visual/preceptual remediation/compensation, Cryotherapy, and Moist heat  PLAN FOR NEXT SESSION: ***   Iona Beard, DPT 08/26/2023, 3:58 PM

## 2023-08-27 ENCOUNTER — Ambulatory Visit: Payer: Commercial Managed Care - HMO | Attending: Family Medicine | Admitting: Physical Therapy

## 2023-08-31 ENCOUNTER — Other Ambulatory Visit: Payer: Self-pay | Admitting: Family Medicine

## 2023-08-31 DIAGNOSIS — Z1231 Encounter for screening mammogram for malignant neoplasm of breast: Secondary | ICD-10-CM

## 2023-09-01 ENCOUNTER — Other Ambulatory Visit (HOSPITAL_COMMUNITY): Payer: Self-pay

## 2023-09-01 NOTE — Telephone Encounter (Signed)
The order for [facility] was changed to Trulicity.

## 2023-09-03 ENCOUNTER — Telehealth: Payer: Self-pay | Admitting: Pharmacist

## 2023-09-03 ENCOUNTER — Other Ambulatory Visit (HOSPITAL_COMMUNITY): Payer: Self-pay

## 2023-09-03 NOTE — Telephone Encounter (Signed)
Pharmacy Patient Advocate Encounter  Received notification from [employer] that Prior Authorization for Trulicity 0.75MG /0.5ML auto-injectors has been APPROVED from 09/03/2023 to 09/02/2024   PA #/Case ID/Reference #: [id number]

## 2023-09-03 NOTE — Telephone Encounter (Signed)
Pharmacy Patient Advocate Encounter   Received notification from Physician's Office that prior authorization for Trulicity 0.75MG /0.5ML auto-injectors is required/requested.   Insurance verification completed.   The patient is insured through [employer] .   Per test claim: PA required; PA submitted to [employer] via CoverMyMeds Key/confirmation #/EOC [id number] Status is pending

## 2023-09-22 ENCOUNTER — Ambulatory Visit (AMBULATORY_SURGERY_CENTER): Payer: Managed Care, Other (non HMO)

## 2023-09-22 VITALS — Ht 65.0 in | Wt 271.0 lb

## 2023-09-22 DIAGNOSIS — Z1211 Encounter for screening for malignant neoplasm of colon: Secondary | ICD-10-CM

## 2023-09-22 MED ORDER — NA SULFATE-K SULFATE-MG SULF 17.5-3.13-1.6 GM/177ML PO SOLN
1.0000 | Freq: Once | ORAL | 0 refills | Status: AC
Start: 2023-09-22 — End: 2023-09-22

## 2023-09-22 NOTE — Progress Notes (Signed)
No egg or soy allergy known to patient  No issues known to pt with past sedation with any surgeries or procedures Patient denies ever being told they had issues or difficulty with intubation  No FH of Malignant Hyperthermia Pt is not on diet pills Trulicity injection weekly patient advised to hold 7 days before procedure Monday 12/2 Pt is not on  home 02  Pt is not on blood thinners  Pt denies issues with constipation  No A fib or A flutter. Hx of SVT.  Have any cardiac testing pending-- no  LOA: independent  Prep: suprep   Patient's chart reviewed by Cathlyn Parsons CNRA prior to previsit and patient appropriate for the [facility].  Previsit completed and red dot placed by patient's name on their procedure day (on provider's schedule).     PV competed with patient. Prep instructions sent via mychart and home address.

## 2023-09-24 ENCOUNTER — Ambulatory Visit
Admission: RE | Admit: 2023-09-24 | Discharge: 2023-09-24 | Disposition: A | Discharge status: Home / Self Care | Payer: Managed Care, Other (non HMO) | Source: Ambulatory Visit

## 2023-09-24 DIAGNOSIS — Z1231 Encounter for screening mammogram for malignant neoplasm of breast: Secondary | ICD-10-CM

## 2023-10-13 ENCOUNTER — Encounter: Payer: Commercial Managed Care - HMO | Admitting: Family Medicine

## 2023-10-20 ENCOUNTER — Encounter: Payer: Commercial Managed Care - HMO | Admitting: Internal Medicine

## 2023-10-26 ENCOUNTER — Telehealth: Payer: Commercial Managed Care - HMO | Admitting: Physician Assistant

## 2023-10-26 DIAGNOSIS — G5601 Carpal tunnel syndrome, right upper limb: Secondary | ICD-10-CM | POA: Diagnosis not present

## 2023-10-26 MED ORDER — IBUPROFEN 800 MG PO TABS: 800.0000 mg | ORAL_TABLET | Freq: Three times a day (TID) | ORAL | 0 refills | Status: AC | PRN

## 2023-10-26 NOTE — Progress Notes (Signed)
Virtual Visit Consent   Ashley Weaver, you are scheduled for a virtual visit with a [facility] provider today. Just as with appointments in the office, your consent must be obtained to participate. Your consent will be active for this visit and any virtual visit you may have with one of our providers in the next 365 days. If you have a MyChart account, a copy of this consent can be sent to you electronically.  As this is a virtual visit, video technology does not allow for your provider to perform a traditional examination. This may limit your provider's ability to fully assess your condition. If your provider identifies any concerns that need to be evaluated in person or the need to arrange testing (such as labs, EKG, etc.), we will make arrangements to do so. Although advances in technology are sophisticated, we cannot ensure that it will always work on either your end or our end. If the connection with a video visit is poor, the visit may have to be switched to a telephone visit. With either a video or telephone visit, we are not always able to ensure that we have a secure connection.  By engaging in this virtual visit, you consent to the provision of healthcare and authorize for your insurance to be billed (if applicable) for the services provided during this visit. Depending on your insurance coverage, you may receive a charge related to this service.  I need to obtain your verbal consent now. Are you willing to proceed with your visit today? Ashley Weaver has provided verbal consent on 10/26/2023 for a virtual visit (video or telephone). Ashley Loveless, PA-C  Date: 10/26/2023 6:20 PM  Virtual Visit via Video Note   I, Ashley Weaver, connected with  Ashley Weaver  ([id number], [birth year]) on 10/26/23 at  6:15 PM EST by a video-enabled telemedicine application and verified that I am speaking with the correct person using two identifiers.  Location: Patient: Virtual Visit  Location Patient: Home Provider: Virtual Visit Location Provider: Home Office   I discussed the limitations of evaluation and management by telemedicine and the availability of in person appointments. The patient expressed understanding and agreed to proceed.    History of Present Illness: Ashley Weaver is a 47 y.o. who identifies as a female who was assigned female at birth, and is being seen today for right arm pain.  HPI: Arm Pain  The incident occurred 3 to 5 days ago (over last 3 days or so). 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 There was no injury mechanism. The pain is present in the right forearm, right elbow and right hand. The quality of the pain is described as shooting and aching. The pain does not radiate. The pain is mild. The pain has been Constant since the incident. Associated symptoms include numbness (index fingetip, middle fingertip, and thumb tip just this morning) and tingling. Pertinent negatives include no chest pain or muscle weakness. The symptoms are aggravated by lifting and movement. She has tried nothing for the symptoms. The treatment provided no relief.     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 (Patient not taking: Reported on 08/12/2023), Disp: 1 each, Rfl: 0   Dulaglutide (TRULICITY) 0.75 MG/0.5ML SOPN, Inject 0.75 mg into the skin once a week., Disp: 6 mL, Rfl: 0   lidocaine (LIDODERM) 5 %, Place 1 patch onto the skin daily. Remove & Discard patch within 12 hours or as directed by MD (Patient not taking: Reported on 09/22/2023), Disp: 30 patch, Rfl: 0   meloxicam (MOBIC) 7.5 MG tablet, Take 1 tablet (7.5 mg total) by mouth daily., Disp: 30 tablet, Rfl: 0   Olmesartan-amLODIPine-HCTZ 40-10-25 MG TABS, Take 1 tablet by mouth daily., Disp: 90 tablet, Rfl: 0   pantoprazole (PROTONIX) 40 MG tablet, Take 1 tablet (40 mg total) by mouth daily., Disp: 90 tablet, Rfl: 3   rosuvastatin (CRESTOR) 10 MG tablet, Take 1 tablet (10 mg total) by mouth daily., Disp: 90 tablet, Rfl: 3  Observations/Objective: Patient is well-developed, well-nourished in no acute distress.  Resting comfortably at home.  Head is normocephalic, atraumatic.  No labored breathing.  Speech is clear and coherent with logical content.  Patient is alert and oriented at baseline.    Assessment and Plan: 1. Carpal tunnel syndrome of right wrist (Primary) - ibuprofen (ADVIL) 800 MG tablet; Take 1 tablet (800 mg total) by mouth every 8 (eight) hours as needed.  Dispense: 30 tablet; Refill: 0  - Suspect carpal tunnel with new pulling activity and heavy lifting at work - Ibuprofen prescribed - May alternate with Tylenol if needed - Discussed wearing a Cock-up wrist brace when at work for support - Ice after heavy work days - May use heat otherwise - Seek in person evaluation  if worsening  Follow Up Instructions: I discussed the assessment and treatment plan with the patient. The patient was provided an opportunity to ask questions and all were answered. The patient agreed with the plan and demonstrated an understanding of the instructions.  A copy of instructions were sent to the patient via MyChart unless otherwise noted below.    The patient was advised to call back or seek an in-person evaluation if the symptoms worsen or if the condition fails to improve as anticipated.    Ashley Loveless, PA-C

## 2023-10-26 NOTE — Patient Instructions (Signed)
Ashley Weaver, thank you for joining Margaretann Loveless, PA-C for today's virtual visit.  While this provider is not your primary care provider (PCP), if your PCP is located in our provider database this encounter information will be shared with them immediately following your visit.   A [facility] MyChart account gives you access to today's visit and all your visits, tests, and labs performed at [facility] " click here if you don't have a [facility] MyChart account or go to [email or web address]  Consent: (Patient) Ashley Weaver provided verbal consent for this virtual visit at the beginning of the encounter.  Current Medications:  Current Outpatient Medications:    ibuprofen (ADVIL) 800 MG tablet, Take 1 tablet (800 mg total) by mouth every 8 (eight) hours as needed., Disp: 30 tablet, Rfl: 0   Blood Pressure Monitoring (BLOOD PRESSURE CUFF) MISC, 1 each by Does not apply route daily. (Patient not taking: Reported on 08/12/2023), Disp: 1 each, Rfl: 0   Dulaglutide (TRULICITY) 0.75 MG/0.5ML SOPN, Inject 0.75 mg into the skin once a week., Disp: 6 mL, Rfl: 0   lidocaine (LIDODERM) 5 %, Place 1 patch onto the skin daily. Remove & Discard patch within 12 hours or as directed by MD (Patient not taking: Reported on 09/22/2023), Disp: 30 patch, Rfl: 0   meloxicam (MOBIC) 7.5 MG tablet, Take 1 tablet (7.5 mg total) by mouth daily., Disp: 30 tablet, Rfl: 0   Olmesartan-amLODIPine-HCTZ 40-10-25 MG TABS, Take 1 tablet by mouth daily., Disp: 90 tablet, Rfl: 0   pantoprazole (PROTONIX) 40 MG tablet, Take 1 tablet (40 mg total) by mouth daily., Disp: 90 tablet, Rfl: 3   rosuvastatin (CRESTOR) 10 MG tablet, Take 1 tablet (10 mg total) by mouth daily., Disp: 90 tablet, Rfl: 3   Medications ordered in this encounter:  Meds ordered this encounter  Medications   ibuprofen (ADVIL) 800 MG tablet    Sig: Take 1 tablet (800 mg total) by mouth every 8 (eight) hours as needed.    Dispense:   30 tablet    Refill:  0    Supervising Provider:   Merrilee Jansky [[id number]]     *If you need refills on other medications prior to your next appointment, please contact your pharmacy*  Follow-Up: Call back or seek an in-person evaluation if the symptoms worsen or if the condition fails to improve as anticipated.  [facility] [phone]  Other Instructions  Cock-Up Wrist splint/brace for support on heavy work days  Carpal Tunnel Syndrome  Carpal tunnel syndrome is a condition that causes pain, numbness, and weakness in your hand and fingers. The carpal tunnel is a narrow area located on the palm side of your wrist. Repeated wrist motion or certain diseases may cause swelling within the tunnel. This swelling pinches the main nerve in the wrist. The main nerve in the wrist is called the median nerve. What are the causes? This condition may be caused by: Repeated and forceful wrist and hand motions. Wrist injuries. Arthritis. A cyst or tumor in the carpal tunnel. Fluid buildup during pregnancy. Use of tools that vibrate. Sometimes the cause of this condition is not known. What increases the risk? The following factors may make you more likely to develop this condition: Having a job that requires you to repeatedly or forcefully move your wrist or hand or requires you to use tools that vibrate. This may include jobs that involve using computers, working on an [employer], or working with power  tools such as drills or sanders. Being a woman. Having certain conditions, such as: Diabetes. Obesity. An underactive thyroid (hypothyroidism). Kidney failure. Rheumatoid arthritis. What are the signs or symptoms? Symptoms of this condition include: A tingling feeling in your fingers, especially in your thumb, index, and middle fingers. Tingling or numbness in your hand. An aching feeling in your entire arm, especially when your wrist and elbow are bent for a long  time. Wrist pain that goes up your arm to your shoulder. Pain that goes down into your palm or fingers. A weak feeling in your hands. You may have trouble grabbing and holding items. Your symptoms may feel worse during the night. How is this diagnosed? This condition is diagnosed with a medical history and physical exam. You may also have tests, including: Electromyogram (EMG). This test measures electrical signals sent by your nerves into the muscles. Nerve conduction study. This test measures how well electrical signals pass through your nerves. Imaging tests, such as X-rays, ultrasound, and MRI. These tests check for possible causes of your condition. How is this treated? This condition may be treated with: Lifestyle changes. It is important to stop or change the activity that caused your condition. Doing exercise and activities to strengthen and stretch your muscles and tendons (physical therapy). Making lifestyle changes to help with your condition and learning how to do your daily activities safely (occupational therapy). Medicines for pain and inflammation. This may include medicine that is injected into your wrist. A wrist splint or brace. Surgery. Follow these instructions at home: If you have a splint or brace: Wear the splint or brace as told by your health care provider. Remove it only as told by your health care provider. Loosen the splint or brace if your fingers tingle, become numb, or turn cold and blue. Keep the splint or brace clean. If the splint or brace is not waterproof: Do not let it get wet. Cover it with a watertight covering when you take a bath or shower. Managing pain, stiffness, and swelling If directed, put ice on the painful area. To do this: If you have a removeable splint or brace, remove it as told by your health care provider. Put ice in a plastic bag. Place a towel between your skin and the bag or between the splint or brace and the bag. Leave the  ice on for 20 minutes, 2-3 times a day. Do not fall asleep with the cold pack on your skin. Remove the ice if your skin turns bright red. This is very important. If you cannot feel pain, heat, or cold, you have a greater risk of damage to the area. Move your fingers often to reduce stiffness and swelling. General instructions Take over-the-counter and prescription medicines only as told by your health care provider. Rest your wrist and hand from any activity that may be causing your pain. If your condition is work related, talk with your employer about changes that can be made, such as getting a wrist pad to use while typing. Do any exercises as told by your health care provider, physical therapist, or occupational therapist. Keep all follow-up visits. This is important. Contact a health care provider if: You have new symptoms. Your pain is not controlled with medicines. Your symptoms get worse. Get help right away if: You have severe numbness or tingling in your wrist or hand. Summary Carpal tunnel syndrome is a condition that causes pain, numbness, and weakness in your hand and fingers. It is usually caused  by repeated wrist motions. Lifestyle changes and medicines are used to treat carpal tunnel syndrome. Surgery may be recommended. Follow your health care provider's instructions about wearing a splint, resting from activity, keeping follow-up visits, and calling for help. This information is not intended to replace advice given to you by your health care provider. Make sure you discuss any questions you have with your health care provider. Document Revised: 03/08/2020 Document Reviewed: 03/08/2020 Elsevier Patient Education  2024 Elsevier Inc.    If you have been instructed to have an in-person evaluation today at a local Urgent Care facility, please use the link below. It will take you to a list of all of our available [facility], including address, phone number and hours  of operation. Please do not delay care.  [facility]  If you or a family member do not have a primary care provider, use the link below to schedule a visit and establish care. When you choose a [facility] primary care physician or advanced practice provider, you gain a long-term partner in health. Find a Primary Care Provider  Learn more about [facility]'s in-office and virtual care options: [facility] Get Care Now

## 2023-11-09 ENCOUNTER — Telehealth: Payer: Commercial Managed Care - HMO | Admitting: Physician Assistant

## 2023-11-09 ENCOUNTER — Encounter: Payer: Self-pay | Admitting: Family Medicine

## 2023-11-09 DIAGNOSIS — J019 Acute sinusitis, unspecified: Secondary | ICD-10-CM

## 2023-11-09 DIAGNOSIS — B9689 Other specified bacterial agents as the cause of diseases classified elsewhere: Secondary | ICD-10-CM

## 2023-11-09 DIAGNOSIS — B379 Candidiasis, unspecified: Secondary | ICD-10-CM | POA: Diagnosis not present

## 2023-11-09 DIAGNOSIS — T3695XA Adverse effect of unspecified systemic antibiotic, initial encounter: Secondary | ICD-10-CM

## 2023-11-09 MED ORDER — AMOXICILLIN-POT CLAVULANATE 875-125 MG PO TABS: 1.0000 | ORAL_TABLET | Freq: Two times a day (BID) | ORAL | 0 refills | Status: AC

## 2023-11-09 MED ORDER — FLUCONAZOLE 150 MG PO TABS: 150.0000 mg | ORAL_TABLET | ORAL | 0 refills | Status: AC | PRN

## 2023-11-09 MED ORDER — FLUTICASONE PROPIONATE 50 MCG/ACT NA SUSP: 2.0000 | Freq: Every day | NASAL | 0 refills | Status: AC

## 2023-11-09 NOTE — Patient Instructions (Signed)
Ashley Weaver, thank you for joining Margaretann Loveless, PA-C for today's virtual visit.  While this provider is not your primary care provider (PCP), if your PCP is located in our provider database this encounter information will be shared with them immediately following your visit.   A [facility] MyChart account gives you access to today's visit and all your visits, tests, and labs performed at [facility] " click here if you don't have a [facility] MyChart account or go to [email or web address]  Consent: (Patient) Ashley Weaver provided verbal consent for this virtual visit at the beginning of the encounter.  Current Medications:  Current Outpatient Medications:    amoxicillin-clavulanate (AUGMENTIN) 875-125 MG tablet, Take 1 tablet by mouth 2 (two) times daily., Disp: 20 tablet, Rfl: 0   fluconazole (DIFLUCAN) 150 MG tablet, Take 1 tablet (150 mg total) by mouth every 3 (three) days as needed., Disp: 2 tablet, Rfl: 0   fluticasone (FLONASE) 50 MCG/ACT nasal spray, Place 2 sprays into both nostrils daily., Disp: 16 g, Rfl: 0   Blood Pressure Monitoring (BLOOD PRESSURE CUFF) MISC, 1 each by Does not apply route daily. (Patient not taking: Reported on 08/12/2023), Disp: 1 each, Rfl: 0   Dulaglutide (TRULICITY) 0.75 MG/0.5ML SOPN, Inject 0.75 mg into the skin once a week., Disp: 6 mL, Rfl: 0   ibuprofen (ADVIL) 800 MG tablet, Take 1 tablet (800 mg total) by mouth every 8 (eight) hours as needed., Disp: 30 tablet, Rfl: 0   lidocaine (LIDODERM) 5 %, Place 1 patch onto the skin daily. Remove & Discard patch within 12 hours or as directed by MD (Patient not taking: Reported on 09/22/2023), Disp: 30 patch, Rfl: 0   meloxicam (MOBIC) 7.5 MG tablet, Take 1 tablet (7.5 mg total) by mouth daily., Disp: 30 tablet, Rfl: 0   Olmesartan-amLODIPine-HCTZ 40-10-25 MG TABS, Take 1 tablet by mouth daily., Disp: 90 tablet, Rfl: 0   pantoprazole (PROTONIX) 40 MG tablet, Take 1 tablet (40 mg  total) by mouth daily., Disp: 90 tablet, Rfl: 3   rosuvastatin (CRESTOR) 10 MG tablet, Take 1 tablet (10 mg total) by mouth daily., Disp: 90 tablet, Rfl: 3   Medications ordered in this encounter:  Meds ordered this encounter  Medications   amoxicillin-clavulanate (AUGMENTIN) 875-125 MG tablet    Sig: Take 1 tablet by mouth 2 (two) times daily.    Dispense:  20 tablet    Refill:  0    Supervising Provider:   Merrilee Jansky [[id number]]   fluticasone (FLONASE) 50 MCG/ACT nasal spray    Sig: Place 2 sprays into both nostrils daily.    Dispense:  16 g    Refill:  0    Supervising Provider:   Merrilee Jansky [[id number]]   fluconazole (DIFLUCAN) 150 MG tablet    Sig: Take 1 tablet (150 mg total) by mouth every 3 (three) days as needed.    Dispense:  2 tablet    Refill:  0    Supervising Provider:   Merrilee Jansky [[id number]]     *If you need refills on other medications prior to your next appointment, please contact your pharmacy*  Follow-Up: Call back or seek an in-person evaluation if the symptoms worsen or if the condition fails to improve as anticipated.  [facility] [phone]  Other Instructions  Sinus Infection, Adult A sinus infection, also called sinusitis, is inflammation of your sinuses. Sinuses are hollow spaces in the bones around your face. Your sinuses  are located: Around your eyes. In the middle of your forehead. Behind your nose. In your cheekbones. Mucus normally drains out of your sinuses. When your nasal tissues become inflamed or swollen, mucus can become trapped or blocked. This allows bacteria, viruses, and fungi to grow, which leads to infection. Most infections of the sinuses are caused by a virus. A sinus infection can develop quickly. It can last for up to 4 weeks (acute) or for more than 12 weeks (chronic). A sinus infection often develops after a cold. What are the causes? This condition is caused by anything that creates swelling in  the sinuses or stops mucus from draining. This includes: Allergies. Asthma. Infection from bacteria or viruses. Deformities or blockages in your nose or sinuses. Abnormal growths in the nose (nasal polyps). Pollutants, such as chemicals or irritants in the air. Infection from fungi. This is rare. What increases the risk? You are more likely to develop this condition if you: Have a weak body defense system (immune system). Do a lot of swimming or diving. Overuse nasal sprays. Smoke. What are the signs or symptoms? The main symptoms of this condition are pain and a feeling of pressure around the affected sinuses. Other symptoms include: Stuffy nose or congestion that makes it difficult to breathe through your nose. Thick yellow or greenish drainage from your nose. Tenderness, swelling, and warmth over the affected sinuses. A cough that may get worse at night. Decreased sense of smell and taste. Extra mucus that collects in the throat or the back of the nose (postnasal drip) causing a sore throat or bad breath. Tiredness (fatigue). Fever. How is this diagnosed? This condition is diagnosed based on: Your symptoms. Your medical history. A physical exam. Tests to find out if your condition is acute or chronic. This may include: Checking your nose for nasal polyps. Viewing your sinuses using a device that has a light (endoscope). Testing for allergies or bacteria. Imaging tests, such as an MRI or CT scan. In rare cases, a bone biopsy may be done to rule out more serious types of fungal sinus disease. How is this treated? Treatment for a sinus infection depends on the cause and whether your condition is chronic or acute. If caused by a virus, your symptoms should go away on their own within 10 days. You may be given medicines to relieve symptoms. They include: Medicines that shrink swollen nasal passages (decongestants). A spray that eases inflammation of the nostrils (topical  intranasal corticosteroids). Rinses that help get rid of thick mucus in your nose (nasal saline washes). Medicines that treat allergies (antihistamines). Over-the-counter pain relievers. If caused by bacteria, your health care provider may recommend waiting to see if your symptoms improve. Most bacterial infections will get better without antibiotic medicine. You may be given antibiotics if you have: A severe infection. A weak immune system. If caused by narrow nasal passages or nasal polyps, surgery may be needed. Follow these instructions at home: Medicines Take, use, or apply over-the-counter and prescription medicines only as told by your health care provider. These may include nasal sprays. If you were prescribed an antibiotic medicine, take it as told by your health care provider. Do not stop taking the antibiotic even if you start to feel better. Hydrate and humidify  Drink enough fluid to keep your urine pale yellow. Staying hydrated will help to thin your mucus. Use a cool mist humidifier to keep the humidity level in your home above 50%. Inhale steam for 10-15 minutes, 3-4  times a day, or as told by your health care provider. You can do this in the bathroom while a hot shower is running. Limit your exposure to cool or dry air. Rest Rest as much as possible. Sleep with your head raised (elevated). Make sure you get enough sleep each night. General instructions  Apply a warm, moist washcloth to your face 3-4 times a day or as told by your health care provider. This will help with discomfort. Use nasal saline washes as often as told by your health care provider. Wash your hands often with soap and water to reduce your exposure to germs. If soap and water are not available, use hand sanitizer. Do not smoke. Avoid being around people who are smoking (secondhand smoke). Keep all follow-up visits. This is important. Contact a health care provider if: You have a fever. Your symptoms  get worse. Your symptoms do not improve within 10 days. Get help right away if: You have a severe headache. You have persistent vomiting. You have severe pain or swelling around your face or eyes. You have vision problems. You develop confusion. Your neck is stiff. You have trouble breathing. These symptoms may be an emergency. Get help right away. Call 911. Do not wait to see if the symptoms will go away. Do not drive yourself to the hospital. Summary A sinus infection is soreness and inflammation of your sinuses. Sinuses are hollow spaces in the bones around your face. This condition is caused by nasal tissues that become inflamed or swollen. The swelling traps or blocks the flow of mucus. This allows bacteria, viruses, and fungi to grow, which leads to infection. If you were prescribed an antibiotic medicine, take it as told by your health care provider. Do not stop taking the antibiotic even if you start to feel better. Keep all follow-up visits. This is important. This information is not intended to replace advice given to you by your health care provider. Make sure you discuss any questions you have with your health care provider. Document Revised: 10/01/2021 Document Reviewed: 10/01/2021 Elsevier Patient Education  2024 Elsevier Inc.    If you have been instructed to have an in-person evaluation today at a local Urgent Care facility, please use the link below. It will take you to a list of all of our available [facility], including address, phone number and hours of operation. Please do not delay care.  [facility]  If you or a family member do not have a primary care provider, use the link below to schedule a visit and establish care. When you choose a [facility] primary care physician or advanced practice provider, you gain a long-term partner in health. Find a Primary Care Provider  Learn more about [facility]'s in-office and virtual care  options: [facility] - Get Care Now

## 2023-11-09 NOTE — Progress Notes (Signed)
Virtual Visit Consent   Ashley Weaver, you are scheduled for a virtual visit with a [facility] provider today. Just as with appointments in the office, your consent must be obtained to participate. Your consent will be active for this visit and any virtual visit you may have with one of our providers in the next 365 days. If you have a MyChart account, a copy of this consent can be sent to you electronically.  As this is a virtual visit, video technology does not allow for your provider to perform a traditional examination. This may limit your provider's ability to fully assess your condition. If your provider identifies any concerns that need to be evaluated in person or the need to arrange testing (such as labs, EKG, etc.), we will make arrangements to do so. Although advances in technology are sophisticated, we cannot ensure that it will always work on either your end or our end. If the connection with a video visit is poor, the visit may have to be switched to a telephone visit. With either a video or telephone visit, we are not always able to ensure that we have a secure connection.  By engaging in this virtual visit, you consent to the provision of healthcare and authorize for your insurance to be billed (if applicable) for the services provided during this visit. Depending on your insurance coverage, you may receive a charge related to this service.  I need to obtain your verbal consent now. Are you willing to proceed with your visit today? [name] has provided verbal consent on 11/09/2023 for a virtual visit (video or telephone). Margaretann Loveless, PA-C  Date: 11/09/2023 7:21 PM  Virtual Visit via Video Note   IMargaretann Loveless, connected with  Ashley Weaver  ([id number], [birth year]) on 11/09/23 at  7:15 PM EST by a video-enabled telemedicine application and verified that I am speaking with the correct person using two identifiers.  Location: Patient: Virtual Visit  Location Patient: Home Provider: Virtual Visit Location Provider: Home Office   I discussed the limitations of evaluation and management by telemedicine and the availability of in person appointments. The patient expressed understanding and agreed to proceed.    History of Present Illness: Ashley Weaver is a 47 y.o. who identifies as a female who was assigned female at birth, and is being seen today for sinus congestion, fevers.  HPI: Sinus Problem This is a new problem. The current episode started in the past 7 days. The problem has been gradually worsening since onset. Maximum temperature: 103? possibly. The fever has been present for 1 to 2 days. The pain is moderate. Associated symptoms include chills, congestion, coughing (worse with laying down), ear pain (right), headaches, sinus pressure and a sore throat (scratchy from drainage). Pertinent negatives include no hoarse voice or shortness of breath. Past treatments include nothing. The treatment provided no relief.  Negative Covid 19 testing  Potential sick contacts  Problems:  Patient Active Problem List   Diagnosis Date Noted   Asymptomatic microscopic hematuria 08/14/2023   Class 3 severe obesity due to excess calories with serious comorbidity and body mass index (BMI) of 45.0 to 49.9 in adult [facility]) 08/14/2023   Vertigo 08/14/2023   Excessive bleeding in premenopausal period 05/12/2023   Hyperlipidemia associated with type 2 diabetes mellitus (HCC) 05/12/2023   Gastroesophageal reflux disease 05/12/2023   Chronic chest wall pain 05/12/2023   SVT (supraventricular tachycardia) (HCC) 01/22/2023   Diabetes 1.5, 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  Morbid obesity (HCC) 03/08/2019   Hypertension associated with diabetes (HCC) 11/22/2017    Allergies:  Allergies  Allergen Reactions   Metformin And Related Other (See Comments)    Headache   Medications:  Current Outpatient Medications:    amoxicillin-clavulanate (AUGMENTIN)  875-125 MG tablet, Take 1 tablet by mouth 2 (two) times daily., Disp: 20 tablet, Rfl: 0   fluconazole (DIFLUCAN) 150 MG tablet, Take 1 tablet (150 mg total) by mouth every 3 (three) days as needed., Disp: 2 tablet, Rfl: 0   fluticasone (FLONASE) 50 MCG/ACT nasal spray, Place 2 sprays into both nostrils daily., Disp: 16 g, Rfl: 0   Blood Pressure Monitoring (BLOOD PRESSURE CUFF) MISC, 1 each by Does not apply route daily. (Patient not taking: Reported on 08/12/2023), Disp: 1 each, Rfl: 0   Dulaglutide (TRULICITY) 0.75 MG/0.5ML SOPN, Inject 0.75 mg into the skin once a week., Disp: 6 mL, Rfl: 0   ibuprofen (ADVIL) 800 MG tablet, Take 1 tablet (800 mg total) by mouth every 8 (eight) hours as needed., Disp: 30 tablet, Rfl: 0   lidocaine (LIDODERM) 5 %, Place 1 patch onto the skin daily. Remove & Discard patch within 12 hours or as directed by MD (Patient not taking: Reported on 09/22/2023), Disp: 30 patch, Rfl: 0   meloxicam (MOBIC) 7.5 MG tablet, Take 1 tablet (7.5 mg total) by mouth daily., Disp: 30 tablet, Rfl: 0   Olmesartan-amLODIPine-HCTZ 40-10-25 MG TABS, Take 1 tablet by mouth daily., Disp: 90 tablet, Rfl: 0   pantoprazole (PROTONIX) 40 MG tablet, Take 1 tablet (40 mg total) by mouth daily., Disp: 90 tablet, Rfl: 3   rosuvastatin (CRESTOR) 10 MG tablet, Take 1 tablet (10 mg total) by mouth daily., Disp: 90 tablet, Rfl: 3  Observations/Objective: Patient is well-developed, well-nourished in no acute distress.  Resting comfortably at home.  Head is normocephalic, atraumatic.  No labored breathing.  Speech is clear and coherent with logical content.  Patient is alert and oriented at baseline.    Assessment and Plan: 1. Acute bacterial sinusitis (Primary) - amoxicillin-clavulanate (AUGMENTIN) 875-125 MG tablet; Take 1 tablet by mouth 2 (two) times daily.  Dispense: 20 tablet; Refill: 0 - fluticasone (FLONASE) 50 MCG/ACT nasal spray; Place 2 sprays into both nostrils daily.  Dispense: 16 g;  Refill: 0  2. Antibiotic-induced yeast infection - fluconazole (DIFLUCAN) 150 MG tablet; Take 1 tablet (150 mg total) by mouth every 3 (three) days as needed.  Dispense: 2 tablet; Refill: 0  - Worsening symptoms that have not responded to OTC medications.  - Will give Augmentin - Fluticasone for nasal congestion and drainage - Continue allergy medications.  - Steam and humidifier can help - Stay well hydrated and get plenty of rest.  - Diflucan given as prophylaxis as patient tends to get vaginal yeast infections with antibiotic use. - Seek in person evaluation if no symptom improvement or if symptoms worsen   Follow Up Instructions: I discussed the assessment and treatment plan with the patient. The patient was provided an opportunity to ask questions and all were answered. The patient agreed with the plan and demonstrated an understanding of the instructions.  A copy of instructions were sent to the patient via MyChart unless otherwise noted below.    The patient was advised to call back or seek an in-person evaluation if the symptoms worsen or if the condition fails to improve as anticipated.    Margaretann Loveless, PA-C

## 2023-11-30 ENCOUNTER — Encounter: Payer: Medicaid Other | Admitting: Obstetrics and Gynecology

## 2023-12-08 ENCOUNTER — Ambulatory Visit: Payer: Medicaid Other | Admitting: Family Medicine

## 2023-12-08 ENCOUNTER — Telehealth: Payer: Self-pay | Admitting: Family Medicine

## 2023-12-09 NOTE — Telephone Encounter (Signed)
1st no show, letter sent via [facility]

## 2023-12-14 ENCOUNTER — Encounter: Payer: Self-pay | Admitting: Internal Medicine

## 2023-12-14 ENCOUNTER — Encounter: Payer: Commercial Managed Care - HMO | Admitting: Internal Medicine

## 2023-12-14 ENCOUNTER — Telehealth: Payer: Self-pay | Admitting: Internal Medicine

## 2023-12-14 NOTE — Telephone Encounter (Signed)
Patient called stating she is going to cancel her procedure for 12/14/2023 at 11:30AM due to her husband having the flu.

## 2023-12-23 ENCOUNTER — Ambulatory Visit (AMBULATORY_SURGERY_CENTER): Payer: Medicaid Other

## 2023-12-23 VITALS — Ht 65.0 in | Wt 279.0 lb

## 2023-12-23 DIAGNOSIS — Z1211 Encounter for screening for malignant neoplasm of colon: Secondary | ICD-10-CM

## 2023-12-23 NOTE — Progress Notes (Signed)

## 2024-01-19 NOTE — Progress Notes (Unsigned)
 Cardiology Office Note    Patient Name: Ashley Weaver Date of Encounter: 01/21/2024  Primary Care Provider:  Garnette Gunner, MD Primary Cardiologist:  Lance Muss, MD Primary Electrophysiologist: None   Past Medical History    Past Medical History:  Diagnosis Date   Allergy 09/2022   Diabetes mellitus without complication (HCC)    GERD (gastroesophageal reflux disease) 01/2019   Hyperlipidemia associated with type 2 diabetes mellitus (HCC) 05/12/2023   Hypertension    Stroke (HCC) 2012   SVT (supraventricular tachycardia) (HCC)     History of Present Illness  Ashley Weaver  is a 48 year old female with a PMH of SVT, HTN, GERD, DM type II who presents today for follow up.   Ashley Weaver was last seen on 05/15/2023 and reported doing well with no recurrence of SVT.  She was noted to have elevated BP and was recently seen by her PCP and placed on losartan.  She had previously not tolerated metoprolol and noted fatigue and muscle stiffness.  Ashley Weaver presents today for 6 to 23-month follow-up.  She reports since her previous visit that she has been experiencing some dizziness and muscle cramps.  Does report resolution of her palpitations and has not experienced any tachycardia since her previous follow-up.  She was seen by her PCP in October and was started on an increased dose of amlodipine.  Today's visit her blood pressure was elevated initially at 162/98 and 168/102 on recheck.  She does report some indiscretions with salt and is currently not following a structured exercise routine.  She reports occasional shortness of breath, both at rest and with activity, which resolves with rest and hydration. She also reports ankle swelling, particularly after prolonged standing or sitting. The patient is currently on a combination of olmesartan, amlodipine, and HCTZ for hypertension, and Trulicity for diabetes. She has not taken her hypertension medication on the day of the visit.  The patient also mentions experiencing muscle spasms in the past, which she attributes to dehydration. Patient denies chest pain, palpitations, dyspnea, PND, orthopnea, nausea, vomiting, dizziness, syncope, edema, weight gain, or early satiety.   Review of Systems  Please see the history of present illness.    All other systems reviewed and are otherwise negative except as noted above.  Physical Exam    Wt Readings from Last 3 Encounters:  01/21/24 273 lb 3.2 oz (123.9 kg)  12/23/23 279 lb (126.6 kg)  09/22/23 271 lb (122.9 kg)   VS: Vitals:   01/21/24 1334 01/21/24 1426  BP: (!) 162/98 (!) 168/102  Pulse: 81   SpO2: 99%   ,Body mass index is 45.46 kg/m. GEN: Well nourished, well developed in no acute distress Neck: No JVD; No carotid bruits Pulmonary: Clear to auscultation without rales, wheezing or rhonchi  Cardiovascular: Normal rate. Regular rhythm. Normal S1. Normal S2.   Murmurs: There is no murmur.  ABDOMEN: Soft, non-tender, non-distended EXTREMITIES:  No edema; No deformity   EKG/LABS/ Recent Cardiac Studies   ECG personally reviewed by me today -none completed today  Risk Assessment/Calculations:      STOP-Bang Score:  6      Lab Results  Component Value Date   WBC 4.0 08/12/2023   HGB 12.5 08/12/2023   HCT 39.5 08/12/2023   MCV 86.0 08/12/2023   PLT 278.0 08/12/2023   Lab Results  Component Value Date   CREATININE 0.71 08/12/2023   BUN 8 08/12/2023   NA 137 08/12/2023   K 4.1 08/12/2023  CL 103 08/12/2023   CO2 26 08/12/2023   Lab Results  Component Value Date   CHOL 183 08/12/2023   HDL 45.20 08/12/2023   LDLCALC 124 (H) 08/12/2023   TRIG 70.0 08/12/2023   CHOLHDL 4 08/12/2023    Lab Results  Component Value Date   HGBA1C 7.3 (A) 08/12/2023   Assessment & Plan    1.  Essential hypertension: -HYPERTENSION CONTROL Vitals:   01/21/24 1334 01/21/24 1426  BP: (!) 162/98 (!) 168/102    [name] blood pressure is elevated above  target today.  In order to address the patient's elevated BP: Blood pressure will be monitored at home to determine if medication changes need to be made.; A current anti-hypertensive medication was adjusted today.; A new medication was prescribed today.    - Patient has experienced lower extremity swelling with increased dose of amlodipine and we will discontinue olmesartan-amlodipine HCTZ today -Patient will start valsartan 80 mg daily and chlorthalidone 12.5 mg daily -We will check a BMET today and in 2 weeks  -Patient will obtain a BP cuff and monitor blood pressures over the next 2 weeks. -Will check 2D echo to rule out hypertensive heart disease.  2.  History of SVT: -Patient reports resolution of symptoms with no recurrence of tachycardia since previous follow-up.  3.  Hyperlipidemia: -Patient's last LDL cholesterol was 124 and was started on Crestor 10 mg -Will recheck LFT and lipids in 2 weeks -Continue Crestor 10 mg daily  4.  DM type II: Trulicity 0.75 mg weekly prescribed. Insurance issues with [employer]. A1c previously checked. - Update medication list to include Trulicity 0.75 mg weekly. - Monitor blood glucose levels after starting Trulicity. - Discuss any side effects with new primary care provider. - Encourage lifestyle modifications including diet and exercise.  5.  Lower extremity edema: -Patient has bilateral +2 lower extremity edema that has increased since beginning amlodipine in October -We will discontinue amlodipine 10 mg today   Disposition: Follow-up with Lance Muss, MD or APP in 1 months    Signed, Napoleon Form, Leodis Rains, NP 01/21/2024, 2:30 PM  [facility]

## 2024-01-20 ENCOUNTER — Ambulatory Visit: Payer: Medicaid Other | Admitting: Internal Medicine

## 2024-01-21 ENCOUNTER — Encounter: Payer: Self-pay | Admitting: Nurse Practitioner

## 2024-01-21 ENCOUNTER — Other Ambulatory Visit (HOSPITAL_COMMUNITY): Payer: Self-pay

## 2024-01-21 ENCOUNTER — Ambulatory Visit: Payer: Medicaid Other | Attending: Nurse Practitioner | Admitting: Nurse Practitioner

## 2024-01-21 ENCOUNTER — Other Ambulatory Visit: Payer: Self-pay

## 2024-01-21 VITALS — BP 168/102 | HR 81 | Ht 65.0 in | Wt 273.2 lb

## 2024-01-21 DIAGNOSIS — E785 Hyperlipidemia, unspecified: Secondary | ICD-10-CM

## 2024-01-21 DIAGNOSIS — I471 Supraventricular tachycardia, unspecified: Secondary | ICD-10-CM

## 2024-01-21 DIAGNOSIS — E139 Other specified diabetes mellitus without complications: Secondary | ICD-10-CM

## 2024-01-21 DIAGNOSIS — I10 Essential (primary) hypertension: Secondary | ICD-10-CM

## 2024-01-21 DIAGNOSIS — R600 Localized edema: Secondary | ICD-10-CM

## 2024-01-21 DIAGNOSIS — E1169 Type 2 diabetes mellitus with other specified complication: Secondary | ICD-10-CM | POA: Diagnosis not present

## 2024-01-21 MED ORDER — CHLORTHALIDONE 25 MG PO TABS
12.5000 mg | ORAL_TABLET | Freq: Every day | ORAL | 1 refills | Status: AC
  Filled 2024-01-21: qty 30, 60d supply, fill #0

## 2024-01-21 MED ORDER — VALSARTAN 80 MG PO TABS
80.0000 mg | ORAL_TABLET | Freq: Every day | ORAL | 2 refills | Status: AC
  Filled 2024-01-21: qty 30, 30d supply, fill #0

## 2024-01-21 MED ORDER — VALSARTAN 80 MG PO TABS: 80.0000 mg | ORAL_TABLET | Freq: Every day | ORAL | 2 refills | Status: DC

## 2024-01-21 MED ORDER — CHLORTHALIDONE 25 MG PO TABS: 12.5000 mg | ORAL_TABLET | Freq: Every day | ORAL | 1 refills | Status: DC

## 2024-01-21 NOTE — Patient Instructions (Addendum)
 Medication Instructions:  STOP Olmesartan/amlodipine/hydrochlorothiazide  START Valsartan 80mg  Take 1 tablet once a day  START Chlorthalidone 12.5mg  Take 1 tablet once a day (tablet may come in 25mg  break tablet in half and take half tablet daily) *If you need a refill on your cardiac medications before your next appointment, please call your pharmacy*   Lab Work: TODAY-BMET 2 WEEKS BMET, FASTING LIPIDS & LFTs If you have labs (blood work) drawn today and your tests are completely normal, you will receive your results only by: MyChart Message (if you have MyChart) OR A paper copy in the mail If you have any lab test that is abnormal or we need to change your treatment, we will call you to review the results.   Testing/Procedures: You can wear the [place] the pin is 1234.  Your physician has requested that you have an echocardiogram. Echocardiography is a painless test that uses sound waves to create images of your heart. It provides your doctor with information about the size and shape of your heart and how well your heart's chambers and valves are working. This procedure takes approximately one hour. There are no restrictions for this procedure. Please do NOT wear cologne, perfume, aftershave, or lotions (deodorant is allowed). Please arrive 15 minutes prior to your appointment time.  Please note: We ask at that you not bring children with you during ultrasound (echo/ vascular) testing. Due to room size and safety concerns, children are not allowed in the ultrasound rooms during exams. Our front office staff cannot provide observation of children in our lobby area while testing is being conducted. An adult accompanying a patient to their appointment will only be allowed in the ultrasound room at the discretion of the ultrasound technician under special circumstances. We apologize for any inconvenience.   Follow-Up: At [facility], you and your health needs are our priority.  As  part of our continuing mission to provide you with exceptional heart care, we have created designated Provider Care Teams.  These Care Teams include your primary Cardiologist (physician) and Advanced Practice Providers (APPs -  Physician Assistants and Nurse Practitioners) who all work together to provide you with the care you need, when you need it.  We recommend signing up for the patient portal called "MyChart".  Sign up information is provided on this After Visit Summary.  MyChart is used to connect with patients for Virtual Visits (Telemedicine).  Patients are able to view lab/test results, encounter notes, upcoming appointments, etc.  Non-urgent messages can be sent to your provider as well.   To learn more about what you can do with MyChart, go to [email or web address].    Your next appointment:   1 month(s)  Provider:   Robin Searing, NP   Other Instructions: PRIMARY CARE PROVIDER DR Dorothyann Peng AND Arnette Felts, FNP PH: [phone]  Heart-Healthy Eating Plan Eating a healthy diet is important for the health of your heart. A heart-healthy eating plan includes: Eating less unhealthy fats. Eating more healthy fats. Eating less salt in your food. Salt is also called sodium. Making other changes in your diet. Talk with your doctor or a diet specialist (dietitian) to create an eating plan that is right for you. What is my plan? Your doctor may recommend an eating plan that includes: Total fat: ______% or less of total calories a day. Saturated fat: ______% or less of total calories a day. Cholesterol: less than _________mg a day. Sodium: less than _________mg a day. What are tips for following  this plan? Cooking Avoid frying your food. Try to bake, boil, grill, or broil it instead. You can also reduce fat by: Removing the skin from poultry. Removing all visible fats from meats. Steaming vegetables in water or broth. Meal planning  At meals, divide your plate into four equal  parts: Fill one-half of your plate with vegetables and green salads. Fill one-fourth of your plate with whole grains. Fill one-fourth of your plate with lean protein foods. Eat 2-4 cups of vegetables per day. One cup of vegetables is: 1 cup (91 g) broccoli or cauliflower florets. 2 medium carrots. 1 large bell pepper. 1 large sweet potato. 1 large tomato. 1 medium white potato. 2 cups (150 g) raw leafy greens. Eat 1-2 cups of fruit per day. One cup of fruit is: 1 small apple 1 large banana 1 cup (237 g) mixed fruit, 1 large orange,  cup (82 g) dried fruit, 1 cup (240 mL) 100% fruit juice. Eat more foods that have soluble fiber. These are apples, broccoli, carrots, beans, peas, and barley. Try to get 20-30 g of fiber per day. Eat 4-5 servings of nuts, legumes, and seeds per week: 1 serving of dried beans or legumes equals  cup (90 g) cooked. 1 serving of nuts is  oz (12 almonds, 24 pistachios, or 7 walnut halves). 1 serving of seeds equals  oz (8 g). General information Eat more home-cooked food. Eat less restaurant, buffet, and fast food. Limit or avoid alcohol. Limit foods that are high in starch and sugar. Avoid fried foods. Lose weight if you are overweight. Keep track of how much salt (sodium) you eat. This is important if you have high blood pressure. Ask your doctor to tell you more about this. Try to add vegetarian meals each week. Fats Choose healthy fats. These include olive oil and canola oil, flaxseeds, walnuts, almonds, and seeds. Eat more omega-3 fats. These include salmon, mackerel, sardines, tuna, flaxseed oil, and ground flaxseeds. Try to eat fish at least 2 times each week. Check food labels. Avoid foods with trans fats or high amounts of saturated fat. Limit saturated fats. These are often found in animal products, such as meats, butter, and cream. These are also found in plant foods, such as palm oil, palm kernel oil, and coconut oil. Avoid foods with  partially hydrogenated oils in them. These have trans fats. Examples are stick margarine, some tub margarines, cookies, crackers, and other baked goods. What foods should I eat? Fruits All fresh, canned (in natural juice), or frozen fruits. Vegetables Fresh or frozen vegetables (raw, steamed, roasted, or grilled). Green salads. Grains Most grains. Choose whole wheat and whole grains most of the time. Rice and pasta, including brown rice and pastas made with whole wheat. Meats and other proteins Lean, well-trimmed beef, veal, pork, and lamb. Chicken and [place] without skin. All fish and shellfish. Wild duck, rabbit, pheasant, and venison. Egg whites or low-cholesterol egg substitutes. Dried beans, peas, lentils, and tofu. Seeds and most nuts. Dairy Low-fat or nonfat cheeses, including ricotta and mozzarella. Skim or 1% milk that is liquid, powdered, or evaporated. Buttermilk that is made with low-fat milk. Nonfat or low-fat yogurt. Fats and oils Non-hydrogenated (trans-free) margarines. Vegetable oils, including soybean, sesame, sunflower, olive, peanut, safflower, corn, canola, and cottonseed. Salad dressings or mayonnaise made with a vegetable oil. Beverages Mineral water. Coffee and tea. Diet carbonated beverages. Sweets and desserts Sherbet, gelatin, and fruit ice. Small amounts of dark chocolate. Limit all sweets and desserts. Seasonings and  condiments All seasonings and condiments. The items listed above may not be a complete list of foods and drinks you can eat. Contact a dietitian for more options. What foods should I avoid? Fruits Canned fruit in heavy syrup. Fruit in cream or butter sauce. Fried fruit. Limit coconut. Vegetables Vegetables cooked in cheese, cream, or butter sauce. Fried vegetables. Grains Breads that are made with saturated or trans fats, oils, or whole milk. Croissants. Sweet rolls. Donuts. High-fat crackers, such as cheese crackers. Meats and other  proteins Fatty meats, such as hot dogs, ribs, sausage, bacon, rib-eye roast or steak. High-fat deli meats, such as salami and bologna. Caviar. Domestic duck and goose. Organ meats, such as liver. Dairy Cream, sour cream, cream cheese, and creamed cottage cheese. Whole-milk cheeses. Whole or 2% milk that is liquid, evaporated, or condensed. Whole buttermilk. Cream sauce or high-fat cheese sauce. Yogurt that is made from whole milk. Fats and oils Meat fat, or shortening. Cocoa butter, hydrogenated oils, palm oil, coconut oil, palm kernel oil. Solid fats and shortenings, including bacon fat, salt pork, lard, and butter. Nondairy cream substitutes. Salad dressings with cheese or sour cream. Beverages Regular sodas and juice drinks with added sugar. Sweets and desserts Frosting. Pudding. Cookies. Cakes. Pies. Milk chocolate or white chocolate. Buttered syrups. Full-fat ice cream or ice cream drinks. The items listed above may not be a complete list of foods and drinks to avoid. Contact a dietitian for more information. Summary Heart-healthy meal planning includes eating less unhealthy fats, eating more healthy fats, and making other changes in your diet. Eat a balanced diet. This includes fruits and vegetables, low-fat or nonfat dairy, lean protein, nuts and legumes, whole grains, and heart-healthy oils and fats. This information is not intended to replace advice given to you by your health care provider. Make sure you discuss any questions you have with your health care provider. Document Revised: 12/02/2021 Document Reviewed: 12/02/2021 Elsevier Patient Education  2024 Elsevier Inc.  Low-Sodium Eating Plan Salt (sodium) helps you keep a healthy balance of fluids in your body. Too much sodium can raise your blood pressure. It can also cause fluid and waste to be held in your body. Your health care provider or dietitian may recommend a low-sodium eating plan if you have high blood pressure  (hypertension), kidney disease, liver disease, or heart failure. Eating less sodium can help lower your blood pressure and reduce swelling. It can also protect your heart, liver, and kidneys. What are tips for following this plan? Reading food labels  Check food labels for the amount of sodium per serving. If you eat more than one serving, you must multiply the listed amount by the number of servings. Choose foods with less than 140 milligrams (mg) of sodium per serving. Avoid foods with 300 mg of sodium or more per serving. Always check how much sodium is in a product, even if the label says "unsalted" or "no salt added." Shopping  Buy products labeled as "low-sodium" or "no salt added." Buy fresh foods. Avoid canned foods and pre-made or frozen meals. Avoid canned, cured, or processed meats. Buy breads that have less than 80 mg of sodium per slice. Cooking  Eat more home-cooked food. Try to eat less restaurant, buffet, and fast food. Try not to add salt when you cook. Use salt-free seasonings or herbs instead of table salt or sea salt. Check with your provider or pharmacist before using salt substitutes. Cook with plant-based oils, such as canola, sunflower, or olive oil.  Meal planning When eating at a restaurant, ask if your food can be made with less salt or no salt. Avoid dishes labeled as brined, pickled, cured, or smoked. Avoid dishes made with soy sauce, miso, or teriyaki sauce. Avoid foods that have monosodium glutamate (MSG) in them. MSG may be added to some restaurant food, sauces, soups, bouillon, and canned foods. Make meals that can be grilled, baked, poached, roasted, or steamed. These are often made with less sodium. General information Try to limit your sodium intake to 1,500-2,300 mg each day, or the amount told by your provider. What foods should I eat? Fruits Fresh, frozen, or canned fruit. Fruit juice. Vegetables Fresh or frozen vegetables. "No salt added" canned  vegetables. "No salt added" tomato sauce and paste. Low-sodium or reduced-sodium tomato and vegetable juice. Grains Low-sodium cereals, such as oats, puffed wheat and rice, and shredded wheat. Low-sodium crackers. Unsalted rice. Unsalted pasta. Low-sodium bread. Whole grain breads and whole grain pasta. Meats and other proteins Fresh or frozen meat, poultry, seafood, and fish. These should have no added salt. Low-sodium canned tuna and salmon. Unsalted nuts. Dried peas, beans, and lentils without added salt. Unsalted canned beans. Eggs. Unsalted nut butters. Dairy Milk. Soy milk. Cheese that is naturally low in sodium, such as ricotta cheese, fresh mozzarella, or Swiss cheese. Low-sodium or reduced-sodium cheese. Cream cheese. Yogurt. Seasonings and condiments Fresh and dried herbs and spices. Salt-free seasonings. Low-sodium mustard and ketchup. Sodium-free salad dressing. Sodium-free light mayonnaise. Fresh or refrigerated horseradish. Lemon juice. Vinegar. Other foods Homemade, reduced-sodium, or low-sodium soups. Unsalted popcorn and pretzels. Low-salt or salt-free chips. The items listed above may not be all the foods and drinks you can have. Talk to a dietitian to learn more. What foods should I avoid? Vegetables Sauerkraut, pickled vegetables, and relishes. Olives. [place] fries. Onion rings. Regular canned vegetables, except low-sodium or reduced-sodium items. Regular canned tomato sauce and paste. Regular tomato and vegetable juice. Frozen vegetables in sauces. Grains Instant hot cereals. Bread stuffing, pancake, and biscuit mixes. Croutons. Seasoned rice or pasta mixes. Noodle soup cups. Boxed or frozen macaroni and cheese. Regular salted crackers. Self-rising flour. Meats and other proteins Meat or fish that is salted, canned, smoked, spiced, or pickled. Precooked or cured meat, such as sausages or meat loaves. Tomasa Blase. Ham. Pepperoni. Hot dogs. Corned beef. Chipped beef. Salt pork. Jerky.  Pickled herring, anchovies, and sardines. Regular canned tuna. Salted nuts. Dairy Processed cheese and cheese spreads. Hard cheeses. Cheese curds. Blue cheese. Feta cheese. String cheese. Regular cottage cheese. Buttermilk. Canned milk. Fats and oils Salted butter. Regular margarine. Ghee. Bacon fat. Seasonings and condiments Onion salt, garlic salt, seasoned salt, table salt, and sea salt. Canned and packaged gravies. Worcestershire sauce. Tartar sauce. Barbecue sauce. Teriyaki sauce. Soy sauce, including reduced-sodium soy sauce. Steak sauce. Fish sauce. Oyster sauce. Cocktail sauce. Horseradish that you find on the shelf. Regular ketchup and mustard. Meat flavorings and tenderizers. Bouillon cubes. Hot sauce. Pre-made or packaged marinades. Pre-made or packaged taco seasonings. Relishes. Regular salad dressings. Salsa. Other foods Salted popcorn and pretzels. Corn chips and puffs. Potato and tortilla chips. Canned or dried soups. Pizza. Frozen entrees and pot pies. The items listed above may not be all the foods and drinks you should avoid. Talk to a dietitian to learn more. This information is not intended to replace advice given to you by your health care provider. Make sure you discuss any questions you have with your health care provider. Document Revised: 11/13/2022 Document Reviewed:  11/13/2022 Elsevier Patient Education  2024 [employer].

## 2024-01-22 LAB — BASIC METABOLIC PANEL
BUN/Creatinine Ratio: 9 (ref 9–23)
BUN: 7 mg/dL (ref 6–24)
CO2: 20 mmol/L (ref 20–29)
Calcium: 8.8 mg/dL (ref 8.7–10.2)
Chloride: 103 mmol/L (ref 96–106)
Creatinine, Ser: 0.77 mg/dL (ref 0.57–1.00)
Glucose: 127 mg/dL — ABNORMAL HIGH (ref 70–99)
Potassium: 4.1 mmol/L (ref 3.5–5.2)
Sodium: 136 mmol/L (ref 134–144)
eGFR: 96 mL/min/{1.73_m2} (ref >59)

## 2024-01-22 NOTE — Progress Notes (Signed)
 No show

## 2024-02-01 ENCOUNTER — Telehealth: Payer: Self-pay

## 2024-02-01 NOTE — Telephone Encounter (Signed)
-----   Message from Terrill B sent at 02/01/2024 10:03 AM EDT ----- Please abstract and route to provider.

## 2024-02-01 NOTE — Telephone Encounter (Signed)
 Requested documents faxed successfully.

## 2024-02-04 LAB — BASIC METABOLIC PANEL WITH GFR
BUN/Creatinine Ratio: 11 (ref 9–23)
BUN: 8 mg/dL (ref 6–24)
CO2: 24 mmol/L (ref 20–29)
Calcium: 9.2 mg/dL (ref 8.7–10.2)
Chloride: 97 mmol/L (ref 96–106)
Creatinine, Ser: 0.73 mg/dL (ref 0.57–1.00)
Glucose: 198 mg/dL — ABNORMAL HIGH (ref 70–99)
Potassium: 4.2 mmol/L (ref 3.5–5.2)
Sodium: 136 mmol/L (ref 134–144)
eGFR: 102 mL/min/{1.73_m2} (ref >59)

## 2024-02-04 LAB — HEPATIC FUNCTION PANEL
ALT: 13 IU/L (ref 0–32)
AST: 13 IU/L (ref 0–40)
Albumin: 4.2 g/dL (ref 3.9–4.9)
Alkaline Phosphatase: 82 IU/L (ref 44–121)
Bilirubin Total: 0.3 mg/dL (ref 0.0–1.2)
Bilirubin, Direct: 0.13 mg/dL (ref 0.00–0.40)
Total Protein: 7.3 g/dL (ref 6.0–8.5)

## 2024-02-04 LAB — LIPID PANEL
Chol/HDL Ratio: 3.6 ratio (ref 0.0–4.4)
Cholesterol, Total: 161 mg/dL (ref 100–199)
HDL: 45 mg/dL (ref >39)
LDL Chol Calc (NIH): 104 mg/dL — ABNORMAL HIGH (ref 0–99)
Triglycerides: 63 mg/dL (ref 0–149)
VLDL Cholesterol Cal: 12 mg/dL (ref 5–40)

## 2024-02-05 ENCOUNTER — Other Ambulatory Visit: Payer: Self-pay

## 2024-02-05 DIAGNOSIS — E1169 Type 2 diabetes mellitus with other specified complication: Secondary | ICD-10-CM

## 2024-02-05 MED ORDER — ROSUVASTATIN CALCIUM 10 MG PO TABS: 20.0000 mg | ORAL_TABLET | Freq: Every day | ORAL | 0 refills | Status: AC

## 2024-02-12 ENCOUNTER — Ambulatory Visit (HOSPITAL_COMMUNITY): Attending: Nurse Practitioner

## 2024-02-12 DIAGNOSIS — E139 Other specified diabetes mellitus without complications: Secondary | ICD-10-CM

## 2024-02-12 DIAGNOSIS — E785 Hyperlipidemia, unspecified: Secondary | ICD-10-CM | POA: Diagnosis not present

## 2024-02-12 DIAGNOSIS — I10 Essential (primary) hypertension: Secondary | ICD-10-CM | POA: Diagnosis not present

## 2024-02-12 DIAGNOSIS — E1169 Type 2 diabetes mellitus with other specified complication: Secondary | ICD-10-CM | POA: Diagnosis not present

## 2024-02-12 DIAGNOSIS — I471 Supraventricular tachycardia, unspecified: Secondary | ICD-10-CM

## 2024-02-12 LAB — ECHOCARDIOGRAM COMPLETE
Area-P 1/2: 5.58 cm2
S' Lateral: 2.4 cm

## 2024-02-18 ENCOUNTER — Ambulatory Visit (HOSPITAL_COMMUNITY)

## 2024-03-03 ENCOUNTER — Ambulatory Visit: Admitting: Nurse Practitioner

## 2024-04-04 NOTE — Progress Notes (Deleted)
 Cardiology Office Note    Patient Name: Ashley Weaver Date of Encounter: 04/04/2024  Primary Care Provider:  Sebastian Beverley NOVAK, MD Primary Cardiologist:  Candyce Reek, MD Primary Electrophysiologist: None   Past Medical History    Past Medical History:  Diagnosis Date   Allergy 09/2022   Diabetes mellitus without complication (HCC)    GERD (gastroesophageal reflux disease) 01/2019   Hyperlipidemia associated with type 2 diabetes mellitus (HCC) 05/12/2023   Hypertension    Stroke (HCC) 2012   SVT (supraventricular tachycardia) (HCC)     History of Present Illness  Ashley Weaver  is a 48 year old female with a PMH of SVT, HTN, GERD, DM type II who presents today for follow up.   Ashley Weaver was last seen on 01/21/2024 for 17-month follow-up.  During visit she reported dizziness and muscle cramps with resolution of palpitations.  Her blood pressure however was elevated at 160s over 100s.  She reported increased swelling with amlodipine  which was discontinued and patient was started on valsartan  80 mg and chlorthalidone  12.5 mg.  She completed a 2D echo that showed normal EF of 60 to 65% with no RWMA and mild LVH with grade 1 DD.   Patient denies chest pain, palpitations, dyspnea, PND, orthopnea, nausea, vomiting, dizziness, syncope, edema, weight gain, or early satiety.   Discussed the use of AI scribe software for clinical note transcription with the patient, who gave verbal consent to proceed.  History of Present Illness    ***Notes:   Review of Systems  Please see the history of present illness.    All other systems reviewed and are otherwise negative except as noted above.  Physical Exam     Wt Readings from Last 3 Encounters:  01/21/24 273 lb 3.2 oz (123.9 kg)  12/23/23 279 lb (126.6 kg)  09/22/23 271 lb (122.9 kg)   CD:Uyzmz were no vitals filed for this visit.,There is no height or weight on file to calculate BMI. GEN: Well nourished, well developed  in no acute distress Neck: No JVD; No carotid bruits Pulmonary: Clear to auscultation without rales, wheezing or rhonchi  Cardiovascular: Normal rate. Regular rhythm. Normal S1. Normal S2.   Murmurs: There is no murmur.  ABDOMEN: Soft, non-tender, non-distended EXTREMITIES:  No edema; No deformity   EKG/LABS/ Recent Cardiac Studies   ECG personally reviewed by me today - ***  Risk Assessment/Calculations:   {Does this patient have ATRIAL FIBRILLATION?:[phone]}  STOP-Bang Score:  6  { Consider Dx Sleep Disordered Breathing or Sleep Apnea  ICD G47.33          :1}    Lab Results  Component Value Date   WBC 4.0 08/12/2023   HGB 12.5 08/12/2023   HCT 39.5 08/12/2023   MCV 86.0 08/12/2023   PLT 278.0 08/12/2023   Lab Results  Component Value Date   CREATININE 0.73 02/04/2024   BUN 8 02/04/2024   NA 136 02/04/2024   K 4.2 02/04/2024   CL 97 02/04/2024   CO2 24 02/04/2024   Lab Results  Component Value Date   CHOL 161 02/04/2024   HDL 45 02/04/2024   LDLCALC 104 (H) 02/04/2024   TRIG 63 02/04/2024   CHOLHDL 3.6 02/04/2024    Lab Results  Component Value Date   HGBA1C 7.3 (A) 08/12/2023   Assessment & Plan    Assessment and Plan Assessment & Plan     1.  Essential hypertension  2.  Hyperlipidemia  3.  History of SVT:  4.  DM type II      Disposition: Follow-up with Candyce Reek, MD or APP in *** months {Are you ordering a CV Procedure (e.g. stress test, cath, DCCV, TEE, etc)?   Press F2        :[id number]}   Signed, Wyn Raddle, Jackee Shove, NP 04/04/2024, 3:23 PM [facility]

## 2024-04-06 ENCOUNTER — Ambulatory Visit: Attending: Nurse Practitioner | Admitting: Nurse Practitioner

## 2024-04-06 DIAGNOSIS — E139 Other specified diabetes mellitus without complications: Secondary | ICD-10-CM

## 2024-04-06 DIAGNOSIS — I471 Supraventricular tachycardia, unspecified: Secondary | ICD-10-CM

## 2024-04-06 DIAGNOSIS — I10 Essential (primary) hypertension: Secondary | ICD-10-CM

## 2024-04-06 DIAGNOSIS — E1169 Type 2 diabetes mellitus with other specified complication: Secondary | ICD-10-CM

## 2024-07-07 ENCOUNTER — Other Ambulatory Visit: Payer: Self-pay

## 2024-07-07 ENCOUNTER — Ambulatory Visit
Admission: RE | Admit: 2024-07-07 | Discharge: 2024-07-07 | Disposition: A | Discharge status: Home / Self Care | Source: Ambulatory Visit | Attending: Physician Assistant

## 2024-07-07 VITALS — BP 174/126 | HR 94 | Temp 98.3°F | Resp 19 | Ht 65.0 in | Wt 273.1 lb

## 2024-07-07 DIAGNOSIS — R0989 Other specified symptoms and signs involving the circulatory and respiratory systems: Secondary | ICD-10-CM | POA: Diagnosis not present

## 2024-07-07 LAB — POC SOFIA SARS ANTIGEN FIA: SARS Coronavirus 2 Ag: POSITIVE — AB

## 2024-07-07 MED ORDER — PAXLOVID (300/100) 20 X 150 MG & 10 X 100MG PO TBPK: 3.0000 | ORAL_TABLET | Freq: Two times a day (BID) | ORAL | 0 refills | Status: AC

## 2024-07-07 NOTE — Discharge Instructions (Addendum)
 You were seen today for concerns of cough, nasal congestion,  Your testing was positive for COVID-19. You are currently on day 2 of symptoms.  This means that you are within the appropriate window to start an antiviral medication called Paxlovid .  I have sent this in to the pharmacy that we have on file. Please note that Paxlovid  can cause nausea, vomiting, has been reported to have a bad taste.  It can also interact with multiple medications.  You are currently taking a medication called Rosuvastatin  . I recommend that you stop taking this for 10 days as this interacts with the Paxlovid .  You can resume it on 07/17/24  You are currently on day 2 of symptoms.  It is recommended that for the first 5 days of symptoms you quarantine and try to isolate yourself from others to help reduce transmission.  You are recommended quarantine period would end on 07/10/2024.  It is recommended that for the following 5 days after that you wear a mask when out and about around others.  This period of time would start on 07/11/2024 and end on 07/15/2024  In addition to the antiviral medication you can take over-the-counter medications such as DayQuil/NyQuil Alka-Seltzer day and night TheraFlu day and night  If you have High blood pressure I recommend taking Mucinex , Robitussin, and Acetaminophen rather than the combination medications. Flonase  nasal spray Nasal saline flushes/rinses Humidifier at night Warm tea with honey Ibuprofen  as needed  Please make sure that you are getting plenty of rest and staying well-hydrated If you develop any of the following symptoms please go to the emergency room: Significant difficulty breathing, chest pain, fevers that are not responding to acetaminophen or ibuprofen , swelling of one of your arms or legs, inability to eat or drink leading to dehydration, confusion, loss of consciousness.

## 2024-07-07 NOTE — ED Triage Notes (Signed)
 Pt presents with a chief complaint of fevers x 2 days. Fevers are accompanied with left ear pain, nasal congestion, headaches, chills, and cough. Two coworkers tested [employer] recently. Currently rates her overall pain a 4/10. Per pt, back and arms are feeling very achy. Denies taking OTC medications PTA for fevers/symptoms.

## 2024-07-07 NOTE — ED Provider Notes (Signed)
 [facility]    CSN: [id number] Arrival date & time: 07/07/24  1503      History   Chief Complaint Chief Complaint  Patient presents with   Fever    Also have chills, dry cough, and left ear ache. Also a runny nose and headache. - Entered by patient    HPI Ashley Weaver is a 48 y.o. female.   HPI  Pt reports that she is here for concerns of dry cough, chills, fever and headaches that started yesterday She denies similar symptoms in others in household  She reports tmax of 102 last night  Interventions:none so far    Past Medical History:  Diagnosis Date   Allergy 09/2022   Diabetes mellitus without complication (HCC)    GERD (gastroesophageal reflux disease) 01/2019   Hyperlipidemia associated with type 2 diabetes mellitus (HCC) 05/12/2023   Hypertension    Stroke (HCC) 2012   SVT (supraventricular tachycardia) (HCC)     Patient Active Problem List   Diagnosis Date Noted   Asymptomatic microscopic hematuria 08/14/2023   Class 3 severe obesity due to excess calories with serious comorbidity and body mass index (BMI) of 45.0 to 49.9 in adult 08/14/2023   Vertigo 08/14/2023   Excessive bleeding in premenopausal period 05/12/2023   Hyperlipidemia associated with type 2 diabetes mellitus (HCC) 05/12/2023   Gastroesophageal reflux disease 05/12/2023   Chronic chest wall pain 05/12/2023   SVT (supraventricular tachycardia) (HCC) 01/22/2023   Diabetes 1.5, 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      Home Medications    Prior to Admission medications   Medication Sig Start Date End Date Taking? Authorizing Provider  chlorthalidone  (HYGROTON ) 25 MG tablet Take 0.5 tablets (12.5 mg total) by mouth daily. 01/21/24  07/07/24 Yes Wyn Jackee VEAR Mickey., NP  nirmatrelvir/ritonavir (PAXLOVID , 300/100,) 20 x 150 MG & 10 x 100MG  TBPK Take 3 tablets by mouth 2 (two) times daily for 5 days. Patient GFR is 102. Take nirmatrelvir (150 mg) two tablets twice daily for 5 days and ritonavir (100 mg) one tablet twice daily for 5 days. 07/07/24 07/12/24 Yes Taryll Reichenberger E, PA-C  amoxicillin -clavulanate (AUGMENTIN ) 875-125 MG tablet Take 1 tablet by mouth 2 (two) times daily. 11/09/23   Vivienne Delon HERO, PA-C  Blood Pressure Monitoring (BLOOD PRESSURE CUFF) MISC 1 each by Does not apply route daily. 05/15/23   Wyn Jackee VEAR Mickey., NP  fluconazole  (DIFLUCAN ) 150 MG tablet Take 1 tablet (150 mg total) by mouth every 3 (three) days as needed. 11/09/23   Vivienne Delon HERO, PA-C  fluticasone  (FLONASE ) 50 MCG/ACT nasal spray Place 2 sprays into both nostrils daily. Patient taking differently: Place 2 sprays into both nostrils daily as needed. 11/09/23   Vivienne Delon HERO, PA-C  ibuprofen  (ADVIL ) 800 MG tablet Take 1 tablet (800 mg total) by mouth every 8 (eight) hours as needed. 10/26/23   Vivienne Delon HERO, PA-C  lidocaine  (LIDODERM ) 5 % Place 1 patch onto the skin daily. Remove & Discard patch within 12 hours or as directed by MD 05/12/23   Sebastian Beverley NOVAK, MD  meloxicam  (MOBIC ) 7.5 MG tablet Take 1 tablet (7.5 mg total) by mouth daily. 05/12/23   Sebastian Beverley NOVAK, MD  Na Sulfate-K Sulfate-Mg Sulfate concentrate 17.5-3.13-1.6 GM/177ML SOLN SMARTSIG:1 Kit(s) By Mouth Once 09/22/23   [provider]  pantoprazole  (PROTONIX ) 40 MG tablet Take 1 tablet (40 mg total) by mouth daily. 05/12/23 05/06/24  Sebastian Beverley NOVAK, MD  rosuvastatin  (CRESTOR ) 10 MG tablet Take 2 tablets (20 mg total) by mouth daily. 02/05/24   Wyn Jackee VEAR Mickey., NP  valsartan  (DIOVAN ) 80 MG tablet Take 1 tablet (80 mg total) by mouth daily. 01/21/24   Wyn Jackee VEAR Mickey., NP    Family History Family History  Problem Relation Age of Onset   Stroke Mother     Diabetes Father    Varicose Veins Maternal Grandmother    Esophageal cancer Maternal Grandfather    Cancer Maternal Grandfather    Diabetes Paternal Grandmother    Colon cancer Paternal Grandfather        AOO onknown   Cancer Paternal Grandfather    Colon polyps Neg Hx    Rectal cancer Neg Hx    Stomach cancer Neg Hx     Social History Social History   Tobacco Use   Smoking status: Former    Types: Cigarettes    Passive exposure: Never   Smokeless tobacco: Never  Substance Use Topics   Alcohol use: Not Currently   Drug use: No     Allergies   Metformin  and related   Review of Systems Review of Systems  Constitutional:  Positive for chills and fever.  HENT:  Positive for congestion, ear pain and postnasal drip. Negative for sore throat.   Respiratory:  Positive for cough (dry cough) and shortness of breath. Negative for chest tightness and wheezing.   Gastrointestinal:  Negative for diarrhea, nausea and vomiting.  Musculoskeletal:  Positive for myalgias.  Skin:  Negative for rash.  Neurological:  Positive for headaches.     Physical Exam Triage Vital Signs ED Triage Vitals  Encounter Vitals Group     BP 07/07/24 1518 (!) 174/126     Girls Systolic BP Percentile --      Girls Diastolic BP Percentile --      Boys Systolic BP Percentile --      Boys Diastolic BP Percentile --      Pulse Rate 07/07/24 1518 94     Resp 07/07/24 1518 19     Temp 07/07/24 1518 98.3 F (36.8 C)     Temp Source 07/07/24 1518 Oral     SpO2 07/07/24 1518 97 %     Weight 07/07/24 1521 273 lb 2.4 oz (123.9 kg)     Height 07/07/24 1521 5' 5 (1.651 m)     Head Circumference --      Peak Flow --      Pain Score 07/07/24 1520 4     Pain Loc --      Pain Education --      Exclude from Growth Chart --    No data found.  Updated Vital Signs BP (!) 174/126 (BP Location: Right Arm) Comment: pt has not taken BP medications today.  Pulse 94   Temp 98.3 F (36.8 C) (Oral)   Resp 19    Ht 5' 5 (1.651 m)   Wt 273 lb 2.4 oz (123.9 kg)   LMP 06/16/2024 (Approximate)   SpO2 97%   BMI 45.45 kg/m   Visual Acuity Right Eye Distance:   Left Eye Distance:   Bilateral Distance:    Right Eye Near:   Left Eye Near:  Bilateral Near:     Physical Exam Vitals reviewed.  Constitutional:      General: She is awake.     Appearance: Normal appearance. She is well-developed and well-groomed.  HENT:     Head: Normocephalic and atraumatic.     Right Ear: Hearing, tympanic membrane and ear canal normal.     Left Ear: Hearing, tympanic membrane and ear canal normal.     Mouth/Throat:     Lips: Pink.     Mouth: Mucous membranes are moist.     Pharynx: Oropharynx is clear. Uvula midline. No pharyngeal swelling, oropharyngeal exudate, posterior oropharyngeal erythema, uvula swelling or postnasal drip.  Cardiovascular:     Rate and Rhythm: Normal rate and regular rhythm.     Pulses: Normal pulses.          Radial pulses are 2+ on the right side and 2+ on the left side.     Heart sounds: Normal heart sounds. No murmur heard.    No friction rub. No gallop.  Pulmonary:     Effort: Pulmonary effort is normal.     Breath sounds: Normal breath sounds. No decreased air movement. No decreased breath sounds, wheezing, rhonchi or rales.  Musculoskeletal:     Cervical back: Normal range of motion and neck supple.  Lymphadenopathy:     Head:     Right side of head: No submental, submandibular or preauricular adenopathy.     Left side of head: No submental, submandibular or preauricular adenopathy.     Cervical:     Right cervical: No superficial cervical adenopathy.    Left cervical: No superficial cervical adenopathy.     Upper Body:     Right upper body: No supraclavicular adenopathy.     Left upper body: No supraclavicular adenopathy.  Skin:    General: Skin is warm and dry.  Neurological:     General: No focal deficit present.     Mental Status: She is alert and oriented to  person, place, and time.  Psychiatric:        Mood and Affect: Mood normal.        Behavior: Behavior normal. Behavior is cooperative.        Thought Content: Thought content normal.      UC Treatments / Results  Labs (all labs ordered are listed, but only abnormal results are displayed) Labs Reviewed  POC SOFIA SARS ANTIGEN FIA - Abnormal; Notable for the following components:      Result Value   SARS Coronavirus 2 Ag Positive (*)    All other components within normal limits    EKG   Radiology No results found.  Procedures Procedures (including critical care time)  Medications Ordered in UC Medications - No data to display  Initial Impression / Assessment and Plan / UC Course  I have reviewed the triage vital signs and the nursing notes.  Pertinent labs & imaging results that were available during my care of the patient were reviewed by me and considered in my medical decision making (see chart for details).      Final Clinical Impressions(s) / UC Diagnoses   Final diagnoses:  Symptoms of upper respiratory infection (URI)    Patient presents today with concerns of cough, chills, fever and headaches that started yesterday.  Physical exam is largely normal and vitals are reassuring with the exception of her blood pressure which is elevated.  She states that she has not taken her blood pressure medications yet this  morning which may explain why her BP is elevated.  Rapid COVID testing is positive.  Patient is currently within the recommended 5-day window for Paxlovid  and is interested in starting antiviral medication.  I used the up-to-date drug interaction tool to review her medications with interactions for Paxlovid .  Reviewed that she should stop rosuvastatin  for 10-day.  While using the Paxlovid  to help prevent interactions.  Reviewed that she can use OTC medications as needed for further symptomatic relief.  ED and return precautions reviewed and provided in AVS.   Follow-up as needed.    Discharge Instructions      You were seen today for concerns of cough, nasal congestion,  Your testing was positive for COVID-19. You are currently on day 2 of symptoms.  This means that you are within the appropriate window to start an antiviral medication called Paxlovid .  I have sent this in to the pharmacy that we have on file. Please note that Paxlovid  can cause nausea, vomiting, has been reported to have a bad taste.  It can also interact with multiple medications.  You are currently taking a medication called Rosuvastatin  . I recommend that you stop taking this for 10 days as this interacts with the Paxlovid .  You can resume it on 07/17/24  In addition to the antiviral medication you can take over-the-counter medications such as DayQuil/NyQuil Alka-Seltzer day and night TheraFlu day and night  If you have High blood pressure I recommend taking Mucinex , Robitussin, and Acetaminophen rather than the combination medications. Flonase  nasal spray Nasal saline flushes/rinses Humidifier at night Warm tea with honey Ibuprofen  as needed  Please make sure that you are getting plenty of rest and staying well-hydrated If you develop any of the following symptoms please go to the emergency room: Significant difficulty breathing, chest pain, fevers that are not responding to acetaminophen or ibuprofen , swelling of one of your arms or legs, inability to eat or drink leading to dehydration, confusion, loss of consciousness.      ED Prescriptions     Medication Sig Dispense Auth. Provider   nirmatrelvir/ritonavir (PAXLOVID , 300/100,) 20 x 150 MG & 10 x 100MG  TBPK Take 3 tablets by mouth 2 (two) times daily for 5 days. Patient GFR is 102. Take nirmatrelvir (150 mg) two tablets twice daily for 5 days and ritonavir (100 mg) one tablet twice daily for 5 days. 30 tablet Jiro Kiester E, PA-C      PDMP not reviewed this encounter.   Beckey Polkowski, Rocky BRAVO, PA-C 07/10/24 1027

## 2024-11-19 ENCOUNTER — Telehealth

## 2024-11-20 ENCOUNTER — Telehealth
